# Patient Record
Sex: Female | Born: 1975 | Race: White | Hispanic: No | Marital: Married | Smoking: Never smoker
Health system: Southern US, Community
[De-identification: ages and names within clinical notes are randomized; demographics above are authoritative.]

## PROBLEM LIST (undated history)

## (undated) DIAGNOSIS — T7840XA Allergy, unspecified, initial encounter: Secondary | ICD-10-CM

## (undated) DIAGNOSIS — Z8669 Personal history of other diseases of the nervous system and sense organs: Secondary | ICD-10-CM

## (undated) DIAGNOSIS — D649 Anemia, unspecified: Secondary | ICD-10-CM

## (undated) DIAGNOSIS — R87629 Unspecified abnormal cytological findings in specimens from vagina: Secondary | ICD-10-CM

## (undated) DIAGNOSIS — E079 Disorder of thyroid, unspecified: Secondary | ICD-10-CM

## (undated) HISTORY — DX: Anemia, unspecified: D64.9

## (undated) HISTORY — DX: Unspecified abnormal cytological findings in specimens from vagina: R87.629

## (undated) HISTORY — DX: Allergy, unspecified, initial encounter: T78.40XA

## (undated) HISTORY — PX: DILATION AND CURETTAGE OF UTERUS: SHX78

## (undated) HISTORY — DX: Disorder of thyroid, unspecified: E07.9

## (undated) HISTORY — DX: Personal history of other diseases of the nervous system and sense organs: Z86.69

---

## 2010-03-02 DIAGNOSIS — D649 Anemia, unspecified: Secondary | ICD-10-CM

## 2010-03-02 HISTORY — DX: Anemia, unspecified: D64.9

## 2010-03-24 ENCOUNTER — Ambulatory Visit: Payer: Self-pay | Admitting: Obstetrics & Gynecology

## 2010-03-25 ENCOUNTER — Encounter: Payer: Self-pay | Admitting: Obstetrics & Gynecology

## 2010-03-25 LAB — CONVERTED CEMR LAB: Yeast Wet Prep HPF POC: NONE SEEN

## 2010-07-14 ENCOUNTER — Ambulatory Visit (HOSPITAL_COMMUNITY): Admission: EM | Admit: 2010-07-14 | Discharge: 2010-07-14 | Payer: Self-pay | Admitting: Internal Medicine

## 2011-04-05 ENCOUNTER — Other Ambulatory Visit: Payer: Self-pay | Admitting: Physician Assistant

## 2011-04-05 ENCOUNTER — Ambulatory Visit (INDEPENDENT_AMBULATORY_CARE_PROVIDER_SITE_OTHER): Payer: BC Managed Care – PPO

## 2011-04-05 DIAGNOSIS — D649 Anemia, unspecified: Secondary | ICD-10-CM

## 2011-04-05 DIAGNOSIS — Z124 Encounter for screening for malignant neoplasm of cervix: Secondary | ICD-10-CM

## 2011-04-05 DIAGNOSIS — Z1151 Encounter for screening for human papillomavirus (HPV): Secondary | ICD-10-CM

## 2011-04-05 DIAGNOSIS — R5381 Other malaise: Secondary | ICD-10-CM

## 2011-04-06 NOTE — Assessment & Plan Note (Signed)
Ruth Parker, Ruth Parker                 ACCOUNT NO.:  [id number]   MEDICAL RECORD NO.:  [id number]          PATIENT TYPE:  POB   LOCATION:  [facility] at [place]         FACILITY:  [facility]   PHYSICIAN:  Allie Bossier, MD        DATE OF BIRTH:  [birth year]   DATE OF SERVICE:  03/24/2010                                  CLINIC NOTE   Ms. Gathright is a 35 year old married, white, gravida 2, para 1, abortus  1.  She has a 24-month-old son.  She comes in here as a new patient.  She has no particular GYN complaints.   PAST MEDICAL HISTORY:  She was noted to be anemic with a hemoglobin of  9.6 on March 02, 2010.  She also possibly has migraines and has seen a  neurologist about this last month.  She complains of some fatigue as  well, and has had a thyroid test that was slightly abnormal, she is  scheduled to have it repeated next week.   SOCIAL HISTORY:  She has been married for 3 years.  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  She complains of breakthrough bleeding on  her Imelda Pillow for the last 3 months.  She denies dyspareunia.  She also  reports some lower back pain, but she thinks that might be from lifting  a 21-pound child.   PAST SURGICAL HISTORY:  D and C for an elective AB.   No latex allergies.  She says the PERCOCET causes her nausea.   MEDICATIONS:  1. Allegra daily.  2. Imelda Pillow daily.  3. Chewable multivitamin/prenatal vitamin daily.  4. She takes Tylenol or ibuprofen on a p.r.n. basis.   PHYSICAL EXAMINATION:  GENERAL:  Well-nourished, well-hydrated, very  thin white female in no apparent distress.  VITAL SIGNS:  Height 5 feet 2 inches, weight 100, blood pressure 125/77,  pulse 87.  HEENT:  Normal.  BREASTS:  Normal bilaterally.  HEART:  Regular rate and rhythm.  LUNGS:  Clear to auscultation bilaterally.  ABDOMEN:  Scaphoid benign.  No palpable hepatosplenomegaly.  EXTERNAL GENITALIA:  No lesions.  Cervix, she is having some bloody  discharge.  There are no gross  abnormalities.  Uterus, normal size and  shape, retroverted, nontender.  Adnexa nontender.  No masses.   ASSESSMENT AND PLAN:  1. Annual exam.  I have checked a Pap smear, recommended self-breast      and self-vulvar exam.  2. Breakthrough bleeding.  I will check again ultrasound as well as      cervical cultures.  When these results are available, she can come      back and we will discuss options.  My suggestion may be changing      her birth control      pills.  She is uncertain as to whether she ever wants to have any      more children.  She described a traumatic birth is her first and      only child.      Allie Bossier, MD     MCD/MEDQ  D:  03/24/2010  T:  03/25/2010  Job:  [id number]

## 2011-06-02 ENCOUNTER — Encounter: Payer: Self-pay | Admitting: *Deleted

## 2011-06-02 ENCOUNTER — Other Ambulatory Visit: Payer: Self-pay | Admitting: *Deleted

## 2011-06-02 ENCOUNTER — Telehealth: Payer: Self-pay

## 2011-06-02 MED ORDER — DROSPIRENONE-ETHINYL ESTRADIOL 3-0.02 MG PO TABS: 1.0000 | ORAL_TABLET | Freq: Every day | ORAL | Status: DC

## 2011-06-02 NOTE — Telephone Encounter (Signed)
Pt's request for Gianvi 90 supply sent to [facility]

## 2011-06-02 NOTE — Telephone Encounter (Signed)
Would like to know if she can have her prescription for Helen Hashimoto rewritten to a 90 day supply called [facility] [place] [address].

## 2012-05-21 ENCOUNTER — Other Ambulatory Visit: Payer: Self-pay | Admitting: Physician Assistant

## 2012-06-15 ENCOUNTER — Other Ambulatory Visit: Payer: Self-pay | Admitting: Physician Assistant

## 2012-06-20 ENCOUNTER — Ambulatory Visit (INDEPENDENT_AMBULATORY_CARE_PROVIDER_SITE_OTHER): Payer: BC Managed Care – PPO | Admitting: Obstetrics & Gynecology

## 2012-06-20 ENCOUNTER — Encounter: Payer: Self-pay | Admitting: Obstetrics & Gynecology

## 2012-06-20 VITALS — BP 112/76 | HR 77 | Temp 98.5°F | Resp 16 | Ht 62.0 in | Wt 108.0 lb

## 2012-06-20 DIAGNOSIS — Z113 Encounter for screening for infections with a predominantly sexual mode of transmission: Secondary | ICD-10-CM

## 2012-06-20 DIAGNOSIS — Z0000 Encounter for general adult medical examination without abnormal findings: Secondary | ICD-10-CM

## 2012-06-20 DIAGNOSIS — Z124 Encounter for screening for malignant neoplasm of cervix: Secondary | ICD-10-CM

## 2012-06-20 DIAGNOSIS — Z1151 Encounter for screening for human papillomavirus (HPV): Secondary | ICD-10-CM

## 2012-06-20 MED ORDER — DROSPIRENONE-ETHINYL ESTRADIOL 3-0.02 MG PO TABS: 1.0000 | ORAL_TABLET | Freq: Every day | ORAL | Status: DC

## 2012-06-20 NOTE — Progress Notes (Signed)
Subjective:    Ruth Parker is a 36 y.o. female who presents for an annual exam. The patient has no complaints today. She reports gaining weight. The patient is sexually active. GYN screening history: last pap: was normal. The patient wears seatbelts: yes. The patient participates in regular exercise: yes. Has the patient ever been transfused or tattooed?: no. The patient reports that there is not domestic violence in her life.   Menstrual History: OB History    Grav Para Term Preterm Abortions TAB SAB Ect Mult Living   2 1   1 1    1       Menarche age: 44 Patient's last menstrual period was 06/17/2012.    The following portions of the patient's history were reviewed and updated as appropriate: allergies, current medications, past family history, past medical history, past social history, past surgical history and problem list.  Review of Systems A comprehensive review of systems was negative. 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 Married for 4 years, denies dysparunia.   Objective:    BP 112/76  Pulse 77  Temp 98.5 F (36.9 C) (Oral)  Resp 16  Ht 5\' 2"  (1.575 m)  Wt 108 lb (48.988 kg)  BMI 19.75 kg/m2  LMP 06/17/2012  General Appearance:    Alert, cooperative, no distress, appears stated age  Head:    Normocephalic, without obvious abnormality, atraumatic  Eyes:    PERRL, conjunctiva/corneas clear, EOM's intact, fundi    benign, both eyes  Ears:    Normal TM's and external ear canals, both ears  Nose:   Nares normal, septum midline, mucosa normal, no drainage    or sinus tenderness  Throat:   Lips, mucosa, and tongue normal; teeth and gums normal  Neck:   Supple, symmetrical, trachea midline, no adenopathy;    thyroid:  no enlargement/tenderness/nodules; no carotid   bruit or JVD  Back:     Symmetric, no curvature, ROM normal, no CVA tenderness  Lungs:     Clear to auscultation bilaterally, respirations unlabored  Chest Wall:    No tenderness or  deformity   Heart:    Regular rate and rhythm, S1 and S2 normal, no murmur, rub   or gallop  Breast Exam:    No tenderness, masses, or nipple abnormality  Abdomen:     Soft, non-tender, bowel sounds active all four quadrants,    no masses, no organomegaly  Genitalia:    Normal female without lesion, discharge or tenderness, NSSA, NT, no adnexal masses     Extremities:   Extremities normal, atraumatic, no cyanosis or edema  Pulses:   2+ and symmetric all extremities  Skin:   Skin color, texture, turgor normal, no rashes or lesions  Lymph nodes:   Cervical, supraclavicular, and axillary nodes normal  Neurologic:   CNII-XII intact, normal strength, sensation and reflexes    throughout  .    Assessment:    Healthy female exam.    Plan:     Pap smear.  I will refill her OCPs, discussed continuous use.

## 2012-06-21 ENCOUNTER — Ambulatory Visit: Payer: BC Managed Care – PPO | Admitting: Obstetrics & Gynecology

## 2012-06-29 ENCOUNTER — Encounter: Payer: Self-pay | Admitting: *Deleted

## 2012-06-29 ENCOUNTER — Telehealth: Payer: Self-pay | Admitting: *Deleted

## 2012-06-29 NOTE — Telephone Encounter (Signed)
Copy of pap smear and info on ASCUS sent to pt and reminded to have pap repeated in 1 year.

## 2013-04-07 ENCOUNTER — Encounter: Payer: Self-pay | Admitting: *Deleted

## 2013-04-07 ENCOUNTER — Emergency Department (INDEPENDENT_AMBULATORY_CARE_PROVIDER_SITE_OTHER)
Admission: EM | Admit: 2013-04-07 | Discharge: 2013-04-07 | Disposition: A | Discharge status: Home / Self Care | Payer: BC Managed Care – PPO | Source: Home / Self Care | Attending: Family Medicine | Admitting: Family Medicine

## 2013-04-07 DIAGNOSIS — H6590 Unspecified nonsuppurative otitis media, unspecified ear: Secondary | ICD-10-CM

## 2013-04-07 DIAGNOSIS — J309 Allergic rhinitis, unspecified: Secondary | ICD-10-CM

## 2013-04-07 DIAGNOSIS — J069 Acute upper respiratory infection, unspecified: Secondary | ICD-10-CM

## 2013-04-07 DIAGNOSIS — J302 Other seasonal allergic rhinitis: Secondary | ICD-10-CM

## 2013-04-07 MED ORDER — PREDNISONE 20 MG PO TABS: 20.0000 mg | ORAL_TABLET | Freq: Two times a day (BID) | ORAL | Status: DC

## 2013-04-07 MED ORDER — AMOXICILLIN 875 MG PO TABS: 875.0000 mg | ORAL_TABLET | Freq: Two times a day (BID) | ORAL | Status: DC

## 2013-04-07 NOTE — ED Provider Notes (Signed)
History     CSN: [id number]  Arrival date & time 04/07/13  4098   First MD Initiated Contact with Patient 04/07/13 1003      Chief Complaint  Patient presents with  . Nasal Congestion  . Otalgia       HPI Comments: Patient complains of increased sinus congestion for about 3 weeks.  Initially she had eye redness, now resolved.  Two weeks ago she visited her PCP who gave her a steroid injection, followed by oral prednisone for five days. Over the past five days she has had a mild sore throat, recurrent sinus congestion, chills, and an occasional cough. She has a history of recurrent sinus congestion.  In December 2013, she had a CT of her head that showed a deviated septum  The history is provided by the patient.    Past Medical History  Diagnosis Date  . Anemia 03/02/10  . History of migraines     Seeing neurologist  . Thyroid disease     abn labs by pcp    Past Surgical History  Procedure Laterality Date  . Dilation and curettage of uterus      elective AB    History reviewed. No pertinent family history.  History  Substance Use Topics  . Smoking status: Not on file  . Smokeless tobacco: Not on file  . Alcohol Use:     OB History   Grav Para Term Preterm Abortions TAB SAB Ect Mult Living   2 1   1 1    1       Review of Systems + sore throat + occasional cough No pleuritic pain No wheezing + nasal congestion + post-nasal drainage ? sinus pain/pressure No itchy/red eyes + left earache No hemoptysis No SOB No fever, + chills No nausea No vomiting No abdominal pain No diarrhea No urinary symptoms No skin rashes + fatigue No myalgias No headache    Allergies  Percocet  Home Medications   Current Outpatient Rx  Name  Route  Sig  Dispense  Refill  . acetaminophen (TYLENOL) 325 MG tablet   Oral   Take 650 mg by mouth every 6 (six) hours as needed.           Marland Kitchen amoxicillin (AMOXIL) 875 MG tablet   Oral   Take 1 tablet (875 mg total) by  mouth 2 (two) times daily.   20 tablet   0   . drospirenone-ethinyl estradiol (GIANVI) 3-0.02 MG tablet   Oral   Take 1 tablet by mouth daily.   3 Package   6   . fexofenadine (ALLEGRA) 180 MG tablet   Oral   Take 180 mg by mouth daily.           . fluticasone (FLONASE) 50 MCG/ACT nasal spray   Nasal   Place 2 sprays into the nose daily.           . naproxen (NAPROSYN) 500 MG tablet   Oral   Take 500 mg by mouth 3 (three) times daily as needed.           . predniSONE (DELTASONE) 20 MG tablet   Oral   Take 1 tablet (20 mg total) by mouth 2 (two) times daily. Take with food.   10 tablet   0     BP 105/64  Pulse 74  Temp(Src) 97.7 F (36.5 C) (Oral)  Resp 16  Ht 5' 2.25" (1.581 m)  Wt 113 lb 4 oz (51.37 kg)  BMI 20.55 kg/m2  SpO2 100%  Physical Exam Nursing notes and Vital Signs reviewed. Appearance:  Patient appears healthy, stated age, and in no acute distress Eyes:  Pupils are equal, round, and reactive to light and accomodation.  Extraocular movement is intact.  Conjunctivae are not inflamed  Ears:  Canals normal.  Tympanic membranes normal but left tympanic membrane has serous effusion present.  Nose:  Congested turbinates, worse on left.  No sinus tenderness.   Pharynx:  Normal Neck:  Supple.  Slightly tender shotty posterior nodes are palpated bilaterally  Lungs:  Clear to auscultation.  Breath sounds are equal.  Heart:  Regular rate and rhythm without murmurs, rubs, or gallops.  Abdomen:  Nontender without masses or hepatosplenomegaly.  Bowel sounds are present.  No CVA or flank tenderness.  Extremities:  No edema.  No calf tenderness Skin:  No rash present.   ED Course  Procedures  none  Labs Reviewed -  Tympanogram:  Normal right ear; Positive peak pressure left ear     1. Seasonal rhinitis   2. Serous otitis media, left   3. Acute upper respiratory infections of unspecified site; suspect new onset viral URI       MDM  Begin  Amoxicillin, and prednisone burst. Take Mucinex D (guaifenesin with decongestant) twice daily for congestion.  Increase fluid intake, rest. May use Afrin nasal spray (or generic oxymetazoline) twice daily for about 5 days.  Also recommend using saline nasal spray several times daily and saline nasal irrigation (AYR is a common brand).  Use Flonase spray after Afrin spray and saline irrigation Stop all antihistamines for now, and other non-prescription cough/cold preparations. Followup with ENT if not improving 10 days.        Lattie Haw, MD 04/09/13 [phone]

## 2013-04-07 NOTE — ED Notes (Signed)
Patient c/o nasal congestion, rhinitis, and left ear pain x 2 wk's. Has tried OTC Allegra, Sudafed, and Alka-selzer with no relief. Patient states she went to her primary and was given Prednisone with no relief.

## 2013-07-31 ENCOUNTER — Other Ambulatory Visit: Payer: Self-pay | Admitting: Obstetrics & Gynecology

## 2014-09-23 ENCOUNTER — Encounter: Payer: Self-pay | Admitting: *Deleted

## 2014-09-24 ENCOUNTER — Other Ambulatory Visit: Payer: Self-pay | Admitting: Obstetrics & Gynecology

## 2014-10-08 ENCOUNTER — Ambulatory Visit (INDEPENDENT_AMBULATORY_CARE_PROVIDER_SITE_OTHER): Payer: BC Managed Care – PPO | Admitting: Obstetrics & Gynecology

## 2014-10-08 ENCOUNTER — Encounter: Payer: Self-pay | Admitting: Obstetrics & Gynecology

## 2014-10-08 VITALS — BP 136/85 | HR 80 | Resp 16 | Ht 62.0 in | Wt 115.0 lb

## 2014-10-08 DIAGNOSIS — Z1151 Encounter for screening for human papillomavirus (HPV): Secondary | ICD-10-CM | POA: Diagnosis not present

## 2014-10-08 DIAGNOSIS — Z124 Encounter for screening for malignant neoplasm of cervix: Secondary | ICD-10-CM | POA: Diagnosis not present

## 2014-10-08 DIAGNOSIS — Z0000 Encounter for general adult medical examination without abnormal findings: Secondary | ICD-10-CM

## 2014-10-08 DIAGNOSIS — Z23 Encounter for immunization: Secondary | ICD-10-CM

## 2014-10-08 MED ORDER — DROSPIRENONE-ETHINYL ESTRADIOL 3-0.02 MG PO TABS: 1.0000 | ORAL_TABLET | Freq: Every day | ORAL | Status: DC

## 2014-10-08 MED ORDER — NAPROXEN 500 MG PO TABS: 500.0000 mg | ORAL_TABLET | Freq: Three times a day (TID) | ORAL | Status: DC | PRN

## 2014-10-08 NOTE — Addendum Note (Signed)
Addended by: Allie BossierVE, Kamal Jurgens C on: 10/08/2014 02:26 PM   Modules accepted: Orders

## 2014-10-08 NOTE — Progress Notes (Signed)
Subjective:    Ruth Parker is a 38 y.o. MW 17P1 (37 yo son) female who presents for an annual exam. The patient has no complaints today. The patient is sexually active. GYN screening history: last pap: was normal. The patient wears seatbelts: yes. The patient participates in regular exercise: no. Has the patient ever been transfused or tattooed?: no. The patient reports that there is not domestic violence in her life.   Menstrual History: OB History    Gravida Para Term Preterm AB TAB SAB Ectopic Multiple Living   2 1   1 1    1       Menarche age: [age]  Patient's last menstrual period was 10/06/2014.    The following portions of the patient's history were reviewed and updated as appropriate: allergies, current medications, past family history, past medical history, past social history, past surgical history and problem list.  Review of Systems A comprehensive review of systems was negative. Monogamous for 17 years, married. Denies dyspareunia. 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    Objective:    BP 136/85 mmHg  Pulse 80  Resp 16  Ht 5\' 2"  (1.575 m)  Wt 115 lb (52.164 kg)  BMI 21.03 kg/m2  LMP 10/06/2014  General Appearance:    Alert, cooperative, no distress, appears stated age  Head:    Normocephalic, without obvious abnormality, atraumatic  Eyes:    PERRL, conjunctiva/corneas clear, EOM's intact, fundi    benign, both eyes  Ears:    Normal TM's and external ear canals, both ears  Nose:   Nares normal, septum midline, mucosa normal, no drainage    or sinus tenderness  Throat:   Lips, mucosa, and tongue normal; teeth and gums normal  Neck:   Supple, symmetrical, trachea midline, no adenopathy;    thyroid:  no enlargement/tenderness/nodules; no carotid   bruit or JVD  Back:     Symmetric, no curvature, ROM normal, no CVA tenderness  Lungs:     Clear to auscultation bilaterally, respirations unlabored  Chest Wall:    No tenderness or deformity   Heart:    Regular rate and rhythm, S1  and S2 normal, no murmur, rub   or gallop  Breast Exam:    No tenderness, masses, or nipple abnormality  Abdomen:     Soft, non-tender, bowel sounds active all four quadrants,    no masses, no organomegaly  Genitalia:    Normal female without lesion, discharge or tenderness, shaved, varicosities, NSSA, NT, normal adnexal exam     Extremities:   Extremities normal, atraumatic, no cyanosis or edema  Pulses:   2+ and symmetric all extremities  Skin:   Skin color, texture, turgor normal, no rashes or lesions  Lymph nodes:   Cervical, supraclavicular, and axillary nodes normal  Neurologic:   CNII-XII intact, normal strength, sensation and reflexes    throughout  .    Assessment:    Healthy female exam.    Plan:     Breast self exam technique reviewed and patient encouraged to perform self-exam monthly. Thin prep Pap smear. with cotesting

## 2014-10-10 LAB — CYTOLOGY - PAP

## 2015-10-18 ENCOUNTER — Other Ambulatory Visit: Payer: Self-pay | Admitting: Obstetrics & Gynecology

## 2015-10-21 ENCOUNTER — Other Ambulatory Visit: Payer: Self-pay | Admitting: Obstetrics & Gynecology

## 2015-10-23 ENCOUNTER — Other Ambulatory Visit: Payer: Self-pay | Admitting: *Deleted

## 2015-10-23 MED ORDER — NAPROXEN 500 MG PO TABS: 500.0000 mg | ORAL_TABLET | Freq: Three times a day (TID) | ORAL | Status: DC | PRN

## 2015-10-23 NOTE — Telephone Encounter (Signed)
RF request for Naprosyn 500 mg  [facility] Marice Potterove

## 2016-03-31 LAB — LIPID PANEL
CHOLESTEROL: 183 mg/dL (ref 0–200)
HDL: 67 mg/dL (ref 35–70)
LDL CALC: 89 mg/dL
TRIGLYCERIDES: 134 mg/dL (ref 40–160)

## 2016-03-31 LAB — BASIC METABOLIC PANEL: Glucose: 89 mg/dL

## 2016-05-07 ENCOUNTER — Encounter: Payer: Self-pay | Admitting: Emergency Medicine

## 2016-05-07 ENCOUNTER — Emergency Department (INDEPENDENT_AMBULATORY_CARE_PROVIDER_SITE_OTHER)
Admission: EM | Admit: 2016-05-07 | Discharge: 2016-05-07 | Disposition: A | Discharge status: Home / Self Care | Payer: Self-pay | Source: Home / Self Care | Attending: Family Medicine | Admitting: Family Medicine

## 2016-05-07 DIAGNOSIS — R197 Diarrhea, unspecified: Secondary | ICD-10-CM

## 2016-05-07 DIAGNOSIS — R1031 Right lower quadrant pain: Secondary | ICD-10-CM | POA: Diagnosis not present

## 2016-05-07 DIAGNOSIS — R112 Nausea with vomiting, unspecified: Secondary | ICD-10-CM

## 2016-05-07 DIAGNOSIS — R1011 Right upper quadrant pain: Secondary | ICD-10-CM

## 2016-05-07 LAB — COMPLETE METABOLIC PANEL WITH GFR
ALT: 10 U/L (ref 6–29)
AST: 19 U/L (ref 10–30)
Albumin: 4.2 g/dL (ref 3.6–5.1)
Alkaline Phosphatase: 38 U/L (ref 33–115)
BUN: 12 mg/dL (ref 7–25)
CO2: 21 mmol/L (ref 20–31)
Calcium: 8.6 mg/dL (ref 8.6–10.2)
Chloride: 106 mmol/L (ref 98–110)
Creat: 0.55 mg/dL (ref 0.50–1.10)
GFR, Est African American: >89 mL/min (ref >=60)
GFR, Est Non African American: >89 mL/min (ref >=60)
Glucose, Bld: 110 mg/dL — ABNORMAL HIGH (ref 65–99)
Potassium: 3.9 mmol/L (ref 3.5–5.3)
Sodium: 139 mmol/L (ref 135–146)
Total Bilirubin: 0.5 mg/dL (ref 0.2–1.2)
Total Protein: 7.2 g/dL (ref 6.1–8.1)

## 2016-05-07 MED ORDER — PROMETHAZINE HCL 25 MG PO TABS: 25.0000 mg | ORAL_TABLET | Freq: Four times a day (QID) | ORAL | Status: DC | PRN

## 2016-05-07 MED ORDER — SODIUM CHLORIDE 0.9 % IV BOLUS (SEPSIS): 1000.0000 mL | Freq: Once | INTRAVENOUS | Status: DC

## 2016-05-07 MED ORDER — ONDANSETRON 4 MG PO TBDP
4.0000 mg | ORAL_TABLET | Freq: Once | ORAL | Status: AC
  Administered 2016-05-07: 4 mg via ORAL

## 2016-05-07 MED ORDER — SODIUM CHLORIDE 0.9 % IV BOLUS (SEPSIS)
1000.0000 mL | Freq: Once | INTRAVENOUS | Status: AC
  Administered 2016-05-07: 1000 mL via INTRAVENOUS

## 2016-05-07 NOTE — ED Notes (Signed)
Nausea, vomiting, diarrhea since 1 am this morning

## 2016-05-07 NOTE — ED Provider Notes (Signed)
CSN: [id number]     Arrival date & time 05/07/16  0830 History   First MD Initiated Contact with Patient 05/07/16 [phone]     Chief Complaint  Patient presents with  . Nausea  . Emesis  . Diarrhea   (Consider location/radiation/quality/duration/timing/severity/associated sxs/prior Treatment) HPI  Ruth Parker is a 40 y.o. female presenting to UC with c/o sudden onset n/v/d that started around 1AM this morning.  Pt states she has had about 10 episodes of vomiting and watery diarrhea. Associated generalized abdominal cramping, worse on Right side.  Pt notes her husband woke up around the same time as well but has only vomited once.  She has been trying to sip on some water but has not been able to keep anything down. No treatments tried PTA.  Denies urinary symptoms. She notes they did eat at a [place] restaurant Wednesday night but have been to that same place many times before.  Denies recent URI symptoms of cough or congestion.  Denies recent travel. Pt denies hx of abdominal surgeries, however, PSH notes dilation and curettage of uterus.  No vaginal symptoms.    Past Medical History  Diagnosis Date  . Anemia 03/02/10  . History of migraines     Seeing neurologist  . Thyroid disease     abn labs by pcp   Past Surgical History  Procedure Laterality Date  . Dilation and curettage of uterus      elective AB   Family History  Problem Relation Age of Onset  . Diabetes Maternal Grandmother   . Breast cancer Maternal Aunt   . Breast cancer Maternal Grandmother   . Glaucoma Mother    Social History  Substance Use Topics  . Smoking status: Never Smoker   . Smokeless tobacco: Never Used  . Alcohol Use: 0.0 oz/week    0 Standard drinks or equivalent per week     Comment: Occasionally   OB History    Gravida Para Term Preterm AB TAB SAB Ectopic Multiple Living   2 1   1 1    1      Review of Systems  Constitutional: Negative for fever and chills.  HENT: Negative for congestion, ear  pain, sore throat, trouble swallowing and voice change.   Respiratory: Negative for cough and shortness of breath.   Cardiovascular: Negative for chest pain and palpitations.  Gastrointestinal: Positive for nausea, vomiting, abdominal pain and diarrhea. Negative for blood in stool.  Genitourinary: Negative for dysuria, urgency, frequency, hematuria, flank pain, vaginal discharge, vaginal pain and pelvic pain.  Musculoskeletal: Negative for myalgias, back pain and arthralgias.  Skin: Negative for rash.  Neurological: Negative for dizziness, light-headedness and headaches.    Allergies  Percocet  Home Medications   Prior to Admission medications   Medication Sig Start Date End Date Taking? Authorizing Provider  acetaminophen (TYLENOL) 325 MG tablet Take 650 mg by mouth every 6 (six) hours as needed.      Historical Provider, MD  Alcaftadine (LASTACAFT) 0.25 % SOLN Apply 1 drop to eye.    Historical Provider, MD  fexofenadine (ALLEGRA) 180 MG tablet Take 180 mg by mouth daily.      Historical Provider, MD  fluticasone (FLONASE) 50 MCG/ACT nasal spray Place 2 sprays into the nose daily.      Historical Provider, MD  LORYNA 3-0.02 MG tablet TAKE 1 TABLET BY MOUTH DAILY. 10/14/14   Myra Inda Coke Dove, MD  LORYNA 3-0.02 MG tablet TAKE 1 TABLET BY MOUTH DAILY. 10/21/15  Allie Bossier, MD  naproxen (NAPROSYN) 500 MG tablet Take 1 tablet (500 mg total) by mouth 3 (three) times daily as needed. 10/23/15   Allie Bossier, MD  polyvinyl alcohol (LIQUIFILM TEARS) 1.4 % ophthalmic solution Apply 1 drop to eye.    Historical Provider, MD   Meds Ordered and Administered this Visit   Medications  ondansetron (ZOFRAN-ODT) disintegrating tablet 4 mg (4 mg Oral Given 05/07/16 0922)  sodium chloride 0.9 % bolus 1,000 mL (1,000 mLs Intravenous Given 05/07/16 0936)    BP 115/80 mmHg  Pulse 107  Temp(Src) 98.3 F (36.8 C) (Oral)  Ht  (1.575 m)  Wt 114 lb (51.71 kg)  BMI 20.85 kg/m2  SpO2 98%  LMP  04/16/2016 No data found.   Physical Exam  Constitutional: She is oriented to person, place, and time. She appears well-developed and well-nourished. No distress.  Pt lying on exam bed, NAD. Alert.   HENT:  Head: Normocephalic and atraumatic.  Mouth/Throat: Oropharynx is clear and moist.  Eyes: Conjunctivae are normal. No scleral icterus.  Neck: Normal range of motion.  Cardiovascular: Regular rhythm and normal heart sounds.  Tachycardia present.   Mild tachycardia  Pulmonary/Chest: Effort normal and breath sounds normal. No respiratory distress. She has no wheezes. She has no rales. She exhibits no tenderness.  Abdominal: Soft. She exhibits no distension and no mass. Bowel sounds are increased. There is tenderness. There is no rebound, no guarding and no CVA tenderness.  Soft, non-distended, mild diffuse tenderness. No rebound, guarding or masses palpated.   Musculoskeletal: Normal range of motion.  Neurological: She is alert and oriented to person, place, and time.  Skin: Skin is warm and dry. She is not diaphoretic.  Nursing note and vitals reviewed.   ED Course  Procedures (including critical care time)  Labs Review Labs Reviewed  COMPLETE METABOLIC PANEL WITH GFR  POCT CBC W AUTO DIFF ([facility])    Imaging Review No results found.    MDM  No diagnosis found.  Pt c/o n/v/d, generalized abdominal pain, worse on Right side w/o rebound or guarding. Mild tachycardia at 107bmp. Pt is afebrile, BP 115/80. Moist mucous membranes.  CBC: unremarkable CMP: pending.  Low concern for appendicitis or cholecystitis given pt's husband also having GI symptoms.  Pt is afebrile and no focal tenderness.  Tx in UC: Zofran and IV fluids.  10:08 AM pt has received about of 1L of fluids, she reports continued sore cramping pain on Right side of abdomen, mild tenderness. No rebound or guarding.  Discussed getting CT abd to r/o appendicitis. Pt very hesitant as she had a  CT in the past for similar symptoms and did not have appendicitis. Concerned for financial reasons.   10:24 AM pt discussed CT vs watch and wait with husband, pt would like to wait to see how she feels later today.  Agree pt is safe to do so given still likely viral in nature as pt's husband has similar symptoms, however, explained due to localized tenderness to RLQ, cannot r/o appendicitis w/o CT scan.  Will provided strict return precautions.   Pt able to keep down several ounces of water in UC. Safe for discharge home. Rx: Phenergan Patient verbalized understanding and agreement with treatment plan.    Junius Finner, PA-C 05/07/16 1031

## 2016-05-10 ENCOUNTER — Telehealth: Payer: Self-pay | Admitting: *Deleted

## 2016-05-10 NOTE — ED Notes (Signed)
Callback: No answer, LMOM f/u from visit, call back as needed.  

## 2016-05-17 ENCOUNTER — Ambulatory Visit (INDEPENDENT_AMBULATORY_CARE_PROVIDER_SITE_OTHER): Payer: BLUE CROSS/BLUE SHIELD | Admitting: Physician Assistant

## 2016-05-17 ENCOUNTER — Encounter: Payer: Self-pay | Admitting: Physician Assistant

## 2016-05-17 VITALS — BP 99/66 | HR 74 | Ht 62.0 in | Wt 114.0 lb

## 2016-05-17 DIAGNOSIS — Z23 Encounter for immunization: Secondary | ICD-10-CM | POA: Diagnosis not present

## 2016-05-17 DIAGNOSIS — Z0000 Encounter for general adult medical examination without abnormal findings: Secondary | ICD-10-CM

## 2016-05-17 DIAGNOSIS — R011 Cardiac murmur, unspecified: Secondary | ICD-10-CM | POA: Diagnosis not present

## 2016-05-17 NOTE — Patient Instructions (Addendum)
Keeping You Healthy  Get These Tests  Blood Pressure- Have your blood pressure checked once a year by your health care provider.  Normal blood pressure is 120/80.  Weight- Have your body mass index (BMI) calculated to screen for obesity.  BMI is measure of body fat based on height and weight.  You can also calculate your own BMI at https://www.west-esparza.com/www.nhlbisupport.com/bmi/.  Cholesterol- Have your cholesterol checked every 5 years starting at age 39 then yearly starting at age 39.  Chlamydia, HIV, and other sexually transmitted diseases- Get screened every year until age 39, then within three months of each new sexual provider.  Pap Test - Every 1-5 years; discuss with your health care provider.  Mammogram- Every 1-2 years starting at age 39--50  Take these medicines  Calcium with Vitamin D-Your body needs 1200 mg of Calcium each day and [phone] IU of Vitamin D daily.  Your body can only absorb 500 mg of Calcium at a time so Calcium must be taken in 2 or 3 divided doses throughout the day.  Multivitamin with folic acid- Once daily if it is possible for you to become pregnant.  Get these Immunizations  Gardasil-Series of three doses; prevents HPV related illness such as genital warts and cervical cancer.  Menactra-Single dose; prevents meningitis.  Tetanus shot- Every 10 years.  Flu shot-Every year.  Take these steps  Do not smoke-Your healthcare provider can help you quit.  For tips on how to quit go to www.smokefree.gov or call 1-800 QUITNOW.  Be physically active- Exercise 5 days a week for at least 30 minutes.  If you are not already physically active, start slow and gradually work up to 30 minutes of moderate physical activity.  Examples of moderate activity include walking briskly, dancing, swimming, bicycling, etc.  Breast Cancer- A self breast exam every month is important for early detection of breast cancer.  For more information and instruction on self breast exams, ask your  healthcare provider or [place].eswww.womenshealth.gov/faq/breast-self-exam.cfm.  Eat a healthy diet- Eat a variety of healthy foods such as fruits, vegetables, whole grains, low fat milk, low fat cheeses, yogurt, lean meats, poultry and fish, beans, nuts, tofu, etc.  For more information go to www. Thenutritionsource.org  Drink alcohol in moderation- Limit alcohol intake to one drink or less per day. Never drink and drive.  Depression- Your emotional health is as important as your physical health.  If you're feeling down or losing interest in things you normally enjoy please talk to your healthcare provider about being screened for depression.  Dental visit- Brush and floss your teeth twice daily; visit your dentist twice a year.  Eye doctor- Get an eye exam at least every 2 years.  Helmet use- Always wear a helmet when riding a bicycle, motorcycle, rollerblading or skateboarding.  Safe sex- If you may be exposed to sexually transmitted infections, use a condom.  Seat belts- Seat belts can save your live; always wear one.  Smoke/Carbon Monoxide detectors- These detectors need to be installed on the appropriate level of your home. Replace batteries at least once a year.  Skin cancer- When out in the sun please cover up and use sunscreen 15 SPF or higher.  Violence- If anyone is threatening or hurting you, please tell your healthcare provider.       Heart Murmur A heart murmur is an extra sound heard by your health care provider when listening to your heart with a device called a stethoscope. The sound comes from turbulence when  blood flows through the heart and may be a "hum" or "whoosh" sound heard when the heart beats. There are two types of heart murmurs:  Innocent murmurs. Most people with this type of heart murmur do not have a heart problem. Many children have innocent heart murmurs. Your health care provider may suggest some basic testing to know whether your murmur is an innocent murmur.  If an innocent heart murmur is found, there is no need for further tests or treatment and no need to restrict activities or stop playing sports.  Abnormal murmurs. These types of murmurs can occur in children and adults. In children, abnormal heart murmurs are typically caused from heart defects that are present at birth (congenital). In adults, abnormal murmurs are usually from heart valve problems caused by disease, infection, or aging. CAUSES  Normally, these valves open to let blood flow through or out of your heart and then shut to keep it from flowing backward. If they do not work properly, you could have:  Regurgitation--When blood leaks back through the valve in the wrong direction.  Mitral valve prolapse--When the mitral valve of the heart has a loose flap and does not close tightly.  Stenosis--When the valve does not open enough and blocks blood flow. SIGNS AND SYMPTOMS  Innocent murmurs do not cause symptoms, and many people with abnormal murmurs may or may not have symptoms. If symptoms do develop, they may include:  Shortness of breath.  Blue coloring of the skin, especially on the fingertips.  Chest pain.  Palpitations, or feeling a fluttering or skipped heartbeat.  Fainting.  Persistent cough.  Getting tired much faster than expected. DIAGNOSIS  A heart murmur might be heard during a sports physical or during any type of examination. When a murmur is heard, it may suggest a possible problem. When this happens, your health care provider may ask you to see a heart specialist (cardiologist). You may also be asked to have one or more heart tests. In these cases, testing may vary depending on what your health care provider heard. Tests for a heart murmur may include:  Electrocardiogram.  Echocardiogram.  MRI. For children and adults who have an abnormal heart murmur and want to play sports, it is important to complete testing, review test results, and receive  recommendations from your health care provider. If heart disease is present, it may not be safe to play. TREATMENT  Innocent murmurs require no treatment or activity restriction. If an abnormal murmur represents a problem with the heart, treatment will depend on the exact nature of the problem. In these cases, medicine or surgery may be needed to treat the problem. HOME CARE INSTRUCTIONS If you want to participate in sports or other types of strenuous physical activity, it is important to discuss this first with your health care provider. If the murmur represents a problem with the heart and you choose to participate in sports, there is a small chance that a serious problem (including sudden death) could result.  SEEK MEDICAL CARE IF:   You feel that your symptoms are slowly worsening.  You develop any new symptoms that cause concern.  You feel that you are having side effects from any medicines prescribed. SEEK IMMEDIATE MEDICAL CARE IF:   You develop chest pain.  You have shortness of breath.  You notice that your heart beats irregularly often enough to cause you to worry.  You have fainting spells.  Your symptoms suddenly get worse.   This information is not  intended to replace advice given to you by your health care provider. Make sure you discuss any questions you have with your health care provider.   Document Released: 12/16/2004 Document Revised: 11/29/2014 Document Reviewed: 07/16/2013 Elsevier Interactive Patient Education [employer]

## 2016-05-17 NOTE — Progress Notes (Signed)
  Subjective:     Ruth Parker is a 40 y.o. female and is here for a comprehensive physical exam. The patient reports no problems.  Social History   Social History  . Marital Status: Married    Spouse Name: N/A  . Number of Children: N/A  . Years of Education: N/A   Occupational History  . Not on file.   Social History Main Topics  . Smoking status: Never Smoker   . Smokeless tobacco: Never Used  . Alcohol Use: 0.0 oz/week    0 Standard drinks or equivalent per week     Comment: Occasionally  . Drug Use: No  . Sexual Activity:    Partners: Male    Birth Control/ Protection: Pill   Other Topics Concern  . Not on file   Social History Narrative   Health Maintenance  Topic Date Due  . HIV Screening  03/10/1992  . TETANUS/TDAP  03/10/1996  . INFLUENZA VACCINE  06/22/2016  . PAP SMEAR  10/08/2017    The following portions of the patient's history were reviewed and updated as appropriate: allergies, current medications, past family history, past medical history, past social history, past surgical history and problem list.  Review of Systems Pertinent items noted in HPI and remainder of comprehensive ROS otherwise negative.   Objective:    BP 99/66 mmHg  Pulse 74  Ht 5\' 2"  (1.575 m)  Wt 114 lb (51.71 kg)  BMI 20.85 kg/m2  LMP 04/16/2016 General appearance: alert, cooperative and appears stated age Head: Normocephalic, without obvious abnormality, atraumatic Eyes: conjunctivae/corneas clear. PERRL, EOM's intact. Fundi benign. Ears: normal TM's and external ear canals both ears Nose: Nares normal. Septum midline. Mucosa normal. No drainage or sinus tenderness. Throat: lips, mucosa, and tongue normal; teeth and gums normal Neck: no adenopathy, no carotid bruit, no JVD, supple, symmetrical, trachea midline and thyroid not enlarged, symmetric, no tenderness/mass/nodules Back: symmetric, no curvature. ROM normal. No CVA tenderness. Lungs: clear to auscultation  bilaterally Heart: Left sternal border heard a faint 2/6 high pitched diasytolic murmur that resolved when auscultated when laying down.  Abdomen: soft, non-tender; bowel sounds normal; no masses,  no organomegaly Extremities: extremities normal, atraumatic, no cyanosis or edema Pulses: 2+ and symmetric Skin: Skin color, texture, turgor normal. No rashes or lesions Lymph nodes: Cervical, supraclavicular, and axillary nodes normal. Neurologic: Alert and oriented X 3, normal strength and tone. Normal symmetric reflexes. Normal coordination and gait    Assessment:    Healthy female exam.       Plan:      CPE- pap up to date. Labs were drawn at [facility]. She signed for us to get records today. Discussed vitamin D 800 units and calcium 1500mg  daily. Encouraged regular diet and exercise. Tdap given today.     Heart murmur- asymptomatic. Handout given. Will get echo to fully evaluate. Reassurance given.  See After Visit Summary for Counseling Recommendations

## 2016-05-18 ENCOUNTER — Other Ambulatory Visit: Payer: Self-pay | Admitting: *Deleted

## 2016-05-18 DIAGNOSIS — R011 Cardiac murmur, unspecified: Secondary | ICD-10-CM

## 2016-05-19 ENCOUNTER — Encounter: Payer: Self-pay | Admitting: Physician Assistant

## 2016-05-26 ENCOUNTER — Ambulatory Visit (HOSPITAL_COMMUNITY)
Admission: RE | Admit: 2016-05-26 | Discharge: 2016-05-26 | Disposition: A | Discharge status: Home / Self Care | Payer: BLUE CROSS/BLUE SHIELD | Source: Ambulatory Visit | Attending: Physician Assistant | Admitting: Physician Assistant

## 2016-05-26 DIAGNOSIS — I340 Nonrheumatic mitral (valve) insufficiency: Secondary | ICD-10-CM | POA: Diagnosis not present

## 2016-05-26 DIAGNOSIS — I071 Rheumatic tricuspid insufficiency: Secondary | ICD-10-CM | POA: Diagnosis not present

## 2016-05-26 DIAGNOSIS — R011 Cardiac murmur, unspecified: Secondary | ICD-10-CM | POA: Diagnosis not present

## 2016-05-26 LAB — ECHOCARDIOGRAM COMPLETE
CHL CUP TV REG PEAK VELOCITY: 171 cm/s
E decel time: 278 msec
EERAT: 5.71
FS: 38 % (ref 28–44)
IVS/LV PW RATIO, ED: 1.27
LA ID, A-P, ES: 29 mm
LA vol A4C: 26.4 ml
LADIAMINDEX: 1.93 cm/m2
LEFT ATRIUM END SYS DIAM: 29 mm
LV E/e' medial: 5.71
LV SIMPSON'S DISK: 60
LV TDI E'LATERAL: 16.9
LV dias vol index: 36 mL/m2
LV e' LATERAL: 16.9 cm/s
LV sys vol index: 14 mL/m2
LV sys vol: 22 mL (ref 14–42)
LVDIAVOL: 54 mL (ref 46–106)
LVEEAVG: 5.71
LVOT SV: 58 mL
LVOT VTI: 23 cm
LVOT area: 2.54 cm2
LVOT diameter: 18 mm
LVOT peak vel: 100 cm/s
MV Dec: 278
MV pk A vel: 48.5 m/s
MVPG: 4 mmHg
MVPKEVEL: 96.5 m/s
PW: 6.9 mm — AB (ref 0.6–1.1)
RV TAPSE: 19.2 mm
Stroke v: 32 ml
TDI e' medial: 11.9
TRMAXVEL: 171 cm/s

## 2016-05-26 NOTE — Progress Notes (Signed)
  Echocardiogram 2D Echocardiogram has been performed.  Ruth SavoyCasey N Jamicia Parker 05/26/2016, 9:51 AM

## 2016-11-23 ENCOUNTER — Other Ambulatory Visit: Payer: Self-pay | Admitting: Obstetrics & Gynecology

## 2016-11-23 MED ORDER — DROSPIRENONE-ETHINYL ESTRADIOL 3-0.02 MG PO TABS: 1.0000 | ORAL_TABLET | Freq: Every day | ORAL | 0 refills | Status: DC

## 2016-11-23 NOTE — Telephone Encounter (Signed)
Pt called requesting a RF on her Yaz.  She is scheduled for annual 12/07/16

## 2016-12-07 ENCOUNTER — Encounter: Payer: Self-pay | Admitting: Obstetrics & Gynecology

## 2016-12-07 ENCOUNTER — Ambulatory Visit (INDEPENDENT_AMBULATORY_CARE_PROVIDER_SITE_OTHER): Payer: BLUE CROSS/BLUE SHIELD | Admitting: Obstetrics & Gynecology

## 2016-12-07 VITALS — BP 127/84 | HR 71 | Ht 62.0 in | Wt 115.0 lb

## 2016-12-07 DIAGNOSIS — Z01419 Encounter for gynecological examination (general) (routine) without abnormal findings: Secondary | ICD-10-CM

## 2016-12-07 DIAGNOSIS — Z0000 Encounter for general adult medical examination without abnormal findings: Secondary | ICD-10-CM

## 2016-12-07 MED ORDER — DROSPIRENONE-ETHINYL ESTRADIOL 3-0.02 MG PO TABS: 1.0000 | ORAL_TABLET | Freq: Every day | ORAL | 5 refills | Status: DC

## 2016-12-07 MED ORDER — DROSPIRENONE-ETHINYL ESTRADIOL 3-0.02 MG PO TABS: 1.0000 | ORAL_TABLET | Freq: Every day | ORAL | 0 refills | Status: DC

## 2016-12-07 NOTE — Progress Notes (Signed)
Subjective:    Ruth Parker is a 41 y.o. MW P68 (62 yo son)  female who presents for an annual exam. The patient has no complaints today. The patient is sexually active. GYN screening history: last pap: was normal. The patient wears seatbelts: yes. The patient participates in regular exercise: yes. Has the patient ever been transfused or tattooed?: no. The patient reports that there is not domestic violence in her life.   Menstrual History: OB History    Gravida Para Term Preterm AB Living   '2 1     1 1   '$ SAB TAB Ectopic Multiple Live Births     1            Menarche age: 65 Patient's last menstrual period was 11/22/2016 (approximate).    The following portions of the patient's history were reviewed and updated as appropriate: allergies, current medications, past family history, past medical history, past social history, past surgical history and problem list.  Review of Systems Pertinent items are noted in HPI.   Monogamous for 20 years.  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 x 2, mGM No gyn/colon can    Objective:    BP 127/84   Pulse 71   Ht '5\' 2"'$  (1.575 m)   Wt 115 lb (52.2 kg)   LMP 11/22/2016 (Approximate)   BMI 21.03 kg/m   General Appearance:    Alert, cooperative, no distress, appears stated age  Head:    Normocephalic, without obvious abnormality, atraumatic  Eyes:    PERRL, conjunctiva/corneas clear, EOM's intact, fundi    benign, both eyes  Ears:    Normal TM's and external ear canals, both ears  Nose:   Nares normal, septum midline, mucosa normal, no drainage    or sinus tenderness  Throat:   Lips, mucosa, and tongue normal; teeth and gums normal  Neck:   Supple, symmetrical, trachea midline, no adenopathy;    thyroid:  no enlargement/tenderness/nodules; no carotid   bruit or JVD  Back:     Symmetric, no curvature, ROM normal, no CVA tenderness  Lungs:     Clear to auscultation bilaterally, respirations unlabored  Chest Wall:    No tenderness  or deformity   Heart:    Regular rate and rhythm, S1 and S2 normal, no murmur, rub   or gallop  Breast Exam:    No tenderness, masses, or nipple abnormality  Abdomen:     Soft, non-tender, bowel sounds active all four quadrants,    no masses, no organomegaly  Genitalia:    Normal female without lesion, discharge or tenderness  0   Extremities:   Extremities normal, atraumatic, no cyanosis or edema  Pulses:   2+ and symmetric all extremities  Skin:   Skin color, texture, turgor normal, no rashes or lesions  Lymph nodes:   Cervical, supraclavicular, and axillary nodes normal  Neurologic:   CNII-XII intact, normal strength, sensation and reflexes    throughout   .    Assessment:    Healthy female exam.    Plan:     Breast self exam technique reviewed and patient encouraged to perform self-exam monthly. Thin prep Pap smear. with cotesting Rec BRCA testing Refill yaz

## 2016-12-08 LAB — CBC
HEMATOCRIT: 36.8 % (ref 35.0–45.0)
Hemoglobin: 11.9 g/dL (ref 11.7–15.5)
MCH: 32.2 pg (ref 27.0–33.0)
MCHC: 32.3 g/dL (ref 32.0–36.0)
MCV: 99.5 fL (ref 80.0–100.0)
MPV: 8.2 fL (ref 7.5–12.5)
Platelets: 248 10*3/uL (ref 140–400)
RBC: 3.7 MIL/uL — ABNORMAL LOW (ref 3.80–5.10)
RDW: 14 % (ref 11.0–15.0)
WBC: 4.6 10*3/uL (ref 3.8–10.8)

## 2016-12-08 LAB — LIPID PANEL
CHOL/HDL RATIO: 2 ratio (ref <5.0)
CHOLESTEROL: 157 mg/dL (ref <200)
HDL: 79 mg/dL (ref >50)
LDL Cholesterol: 62 mg/dL (ref <100)
Triglycerides: 81 mg/dL (ref <150)
VLDL: 16 mg/dL (ref <30)

## 2016-12-08 LAB — COMPREHENSIVE METABOLIC PANEL
ALK PHOS: 40 U/L (ref 33–115)
ALT: 13 U/L (ref 6–29)
AST: 20 U/L (ref 10–30)
Albumin: 4.3 g/dL (ref 3.6–5.1)
BUN: 8 mg/dL (ref 7–25)
CALCIUM: 9.3 mg/dL (ref 8.6–10.2)
CO2: 25 mmol/L (ref 20–31)
Chloride: 102 mmol/L (ref 98–110)
Creat: 0.59 mg/dL (ref 0.50–1.10)
GLUCOSE: 92 mg/dL (ref 65–99)
POTASSIUM: 4.6 mmol/L (ref 3.5–5.3)
Sodium: 136 mmol/L (ref 135–146)
Total Bilirubin: 0.6 mg/dL (ref 0.2–1.2)
Total Protein: 7.3 g/dL (ref 6.1–8.1)

## 2016-12-08 LAB — TSH: TSH: 1.84 mIU/L

## 2016-12-14 LAB — CYTOLOGY - PAP
Diagnosis: NEGATIVE
HPV: NOT DETECTED

## 2016-12-21 ENCOUNTER — Other Ambulatory Visit: Payer: Self-pay | Admitting: Obstetrics & Gynecology

## 2016-12-22 ENCOUNTER — Other Ambulatory Visit: Payer: Self-pay | Admitting: *Deleted

## 2016-12-22 DIAGNOSIS — N946 Dysmenorrhea, unspecified: Secondary | ICD-10-CM

## 2016-12-22 MED ORDER — NAPROXEN 500 MG PO TABS: 500.0000 mg | ORAL_TABLET | Freq: Three times a day (TID) | ORAL | 12 refills | Status: DC | PRN

## 2017-01-11 ENCOUNTER — Ambulatory Visit (INDEPENDENT_AMBULATORY_CARE_PROVIDER_SITE_OTHER): Payer: BLUE CROSS/BLUE SHIELD | Admitting: Physician Assistant

## 2017-01-11 VITALS — BP 122/73 | HR 81 | Temp 98.1°F | Ht 62.0 in | Wt 115.0 lb

## 2017-01-11 DIAGNOSIS — R39198 Other difficulties with micturition: Secondary | ICD-10-CM

## 2017-01-11 DIAGNOSIS — R1031 Right lower quadrant pain: Secondary | ICD-10-CM | POA: Diagnosis not present

## 2017-01-11 DIAGNOSIS — R110 Nausea: Secondary | ICD-10-CM

## 2017-01-11 DIAGNOSIS — R140 Abdominal distension (gaseous): Secondary | ICD-10-CM | POA: Diagnosis not present

## 2017-01-11 LAB — POCT URINALYSIS DIPSTICK
Bilirubin, UA: NEGATIVE
Glucose, UA: NEGATIVE
KETONES UA: NEGATIVE
Leukocytes, UA: NEGATIVE
Nitrite, UA: NEGATIVE
PROTEIN UA: NEGATIVE
RBC UA: NEGATIVE
SPEC GRAV UA: 1.025
Urobilinogen, UA: 0.2
pH, UA: 6.5

## 2017-01-11 LAB — POCT URINE PREGNANCY: Preg Test, Ur: NEGATIVE

## 2017-01-11 NOTE — Progress Notes (Signed)
   Subjective:    Patient ID: Ruth Parker, female    DOB: 10-Jan-1976, 41 y.o.   MRN: [id number]  HPI  Pt is a 41 yo female who presents to the clinic with RLQ pain for last 3 weeks. Describes pain as dull but persistent. She is nauseated but no vomiting. She is having normal bowel movements and frequent at times and loose in nature. Denies any melena or hematochezia. She has had more frequent urination but denies any dysuria. No fever, chills, body aches. She admits to bloating that seems to be worsening.    Review of Systems  All other systems reviewed and are negative.      Objective:   Physical Exam  Constitutional: She is oriented to person, place, and time. She appears well-developed and well-nourished.  Cardiovascular: Normal rate, regular rhythm and normal heart sounds.   Pulmonary/Chest: Effort normal and breath sounds normal.  NO CVA tenderness.   Abdominal:  Distended abdomen with visual bloating in lower quadrant.  Moderate RLQ tenderness to palpation. No guarding or rebound.  Negative psoas sign.   Neurological: She is alert and oriented to person, place, and time.  Psychiatric: She has a normal mood and affect. Her behavior is normal.          Assessment & Plan:  Marland Kitchen.Marland Kitchen.Diagnoses and all orders for this visit:  RLQ abdominal pain -     Urine Culture -     POCT urinalysis dipstick -     CT ABDOMEN PELVIS W CONTRAST; Future  Difficulty in urination -     Urine Culture -     POCT urinalysis dipstick -     CT ABDOMEN PELVIS W CONTRAST; Future  Bloating -     POCT urine pregnancy -     CT ABDOMEN PELVIS W CONTRAST; Future  Nausea -     POCT urine pregnancy -     CT ABDOMEN PELVIS W CONTRAST; Future   .Marland Kitchen. Results for orders placed or performed in visit on 01/11/17  POCT urinalysis dipstick  Result Value Ref Range   Color, UA yellow    Clarity, UA clear    Glucose, UA neg    Bilirubin, UA neg    Ketones, UA neg    Spec Grav, UA 1.025    Blood, UA neg    pH, UA 6.5    Protein, UA neg    Urobilinogen, UA 0.2    Nitrite, UA neg    Leukocytes, UA Negative Negative  POCT urine pregnancy  Result Value Ref Range   Preg Test, Ur Negative Negative   Does not appear to be UTI. Will culture.  Negative for pregnancy.   Pt is having regular bowel movements not as concerning for constipation.  Concerned with visual bloating and tenderness.  Have to be concerned with diverticulitis vs ovarian cysts vs uterine fibroid.  Will get CT scan.

## 2017-01-11 NOTE — Progress Notes (Signed)
Call pt: negative for pregnancy and urinalysis looks perfect.

## 2017-01-11 NOTE — Patient Instructions (Signed)
Will CT scan of abdomen.

## 2017-01-12 DIAGNOSIS — R39198 Other difficulties with micturition: Secondary | ICD-10-CM | POA: Insufficient documentation

## 2017-01-12 DIAGNOSIS — R110 Nausea: Secondary | ICD-10-CM | POA: Insufficient documentation

## 2017-01-12 DIAGNOSIS — R140 Abdominal distension (gaseous): Secondary | ICD-10-CM | POA: Insufficient documentation

## 2017-01-12 DIAGNOSIS — R1031 Right lower quadrant pain: Secondary | ICD-10-CM | POA: Insufficient documentation

## 2017-01-13 LAB — URINE CULTURE: Organism ID, Bacteria: NO GROWTH

## 2017-03-08 ENCOUNTER — Ambulatory Visit (INDEPENDENT_AMBULATORY_CARE_PROVIDER_SITE_OTHER): Payer: BLUE CROSS/BLUE SHIELD | Admitting: Family Medicine

## 2017-03-08 ENCOUNTER — Encounter: Payer: Self-pay | Admitting: Family Medicine

## 2017-03-08 ENCOUNTER — Telehealth: Payer: Self-pay

## 2017-03-08 ENCOUNTER — Other Ambulatory Visit: Payer: Self-pay | Admitting: Family Medicine

## 2017-03-08 VITALS — BP 118/67 | HR 80 | Temp 98.0°F | Ht 62.0 in | Wt 113.0 lb

## 2017-03-08 DIAGNOSIS — J301 Allergic rhinitis due to pollen: Secondary | ICD-10-CM

## 2017-03-08 DIAGNOSIS — J0100 Acute maxillary sinusitis, unspecified: Secondary | ICD-10-CM

## 2017-03-08 MED ORDER — FLUTICASONE PROPIONATE 50 MCG/ACT NA SUSP: 2.0000 | Freq: Every day | NASAL | 99 refills | Status: DC

## 2017-03-08 MED ORDER — AMOXICILLIN-POT CLAVULANATE 875-125 MG PO TABS: 1.0000 | ORAL_TABLET | Freq: Two times a day (BID) | ORAL | 0 refills | Status: DC

## 2017-03-08 MED ORDER — OLOPATADINE HCL 0.1 % OP SOLN: 1.0000 [drp] | Freq: Two times a day (BID) | OPHTHALMIC | 99 refills | Status: DC

## 2017-03-08 MED ORDER — PSEUDOEPHEDRINE HCL ER 120 MG PO TB12: 120.0000 mg | ORAL_TABLET | Freq: Every day | ORAL | 1 refills | Status: DC | PRN

## 2017-03-08 MED ORDER — PREDNISONE 20 MG PO TABS: 40.0000 mg | ORAL_TABLET | Freq: Every day | ORAL | 0 refills | Status: DC

## 2017-03-08 NOTE — Telephone Encounter (Signed)
OK, rx sent to [facility]  

## 2017-03-08 NOTE — Progress Notes (Signed)
   Subjective:    Patient ID: Ruth Parker, female    DOB: [birth year], 41 y.o.   MRN: [id number]  HPI 41 year old female comes in today with 3 weeks of sinus congestion, facial pressure, headache. She's been mostly using Sudafed, Flonase and Allegra thinking it could be allergies. Used to be on allergy shots years ago.  Says now getting right maxillary facial pressure and ST.  NO fever, chills or sweats.     Review of Systems     Objective:   Physical Exam  Constitutional: She is oriented to person, place, and time. She appears well-developed and well-nourished.  HENT:  Head: Normocephalic and atraumatic.  Right Ear: External ear normal.  Left Ear: External ear normal.  Nose: Nose normal.  Mouth/Throat: Oropharynx is clear and moist.  TMs and canals are clear.   Eyes: Conjunctivae and EOM are normal. Pupils are equal, round, and reactive to light.  Neck: Neck supple. No thyromegaly present.  Tender swollen right anterior cervical LN.   Cardiovascular: Normal rate, regular rhythm and normal heart sounds.   Pulmonary/Chest: Effort normal and breath sounds normal. She has no wheezes.  Lymphadenopathy:    She has no cervical adenopathy.  Neurological: She is alert and oriented to person, place, and time.  Skin: Skin is warm and dry.  Psychiatric: She has a normal mood and affect.       Assessment & Plan:  Acute sinusitis-we'll treat with Augmentin and a 5 day course of prednisone since I do think her allergies are significant component to this. Call if not significantly better in one week.  Allergic rhinitis-severe symptoms-treat with 5 days of prednisone. Continue with Flonase and Allegra. She was okay to add Benadryl at bedtime to help her sleep. That's perfectly fine. It may just cause some increased dry mouth.

## 2017-03-08 NOTE — Patient Instructions (Addendum)

## 2017-03-08 NOTE — Telephone Encounter (Signed)
Could you write a script for flonase, sudafed, and pantanol eye drops.  Pt has a prepaid card that she can use for OTC meds, but the pharmacy needs a script to use the card.  Please advise.

## 2017-03-09 NOTE — Telephone Encounter (Signed)
Notified patient.

## 2017-03-30 ENCOUNTER — Ambulatory Visit (INDEPENDENT_AMBULATORY_CARE_PROVIDER_SITE_OTHER): Payer: BLUE CROSS/BLUE SHIELD

## 2017-03-30 DIAGNOSIS — R1031 Right lower quadrant pain: Secondary | ICD-10-CM

## 2017-03-30 DIAGNOSIS — R140 Abdominal distension (gaseous): Secondary | ICD-10-CM

## 2017-03-30 MED ORDER — IOPAMIDOL (ISOVUE-300) INJECTION 61%
100.0000 mL | Freq: Once | INTRAVENOUS | Status: AC | PRN
  Administered 2017-03-30: 100 mL via INTRAVENOUS

## 2017-03-30 NOTE — Progress Notes (Signed)
Call pt: CT scan is unremarkable.

## 2017-05-12 IMAGING — CT CT ABD-PELV W/ CM
2 of 5 series · 15 of 46 positions shown, 17 images · IV contrast (APPLIED)
Comparison: None.

CLINICAL DATA: Right lower quadrant pain end tenderness.  Bloating.

EXAM:
CT ABDOMEN AND PELVIS WITH CONTRAST
TECHNIQUE: Multidetector CT imaging of the abdomen and pelvis was performed
using the standard protocol following bolus administration of
intravenous contrast.
CONTRAST:  100mL CNSJER-0SS IOPAMIDOL (CNSJER-0SS) INJECTION 61%

[Series 2: axial st · axial · 0.59mm/px · z∈[+647,+1067]mm · 12 of 94 slices shown, 14 images]
[im 5/94  soft-tissue]
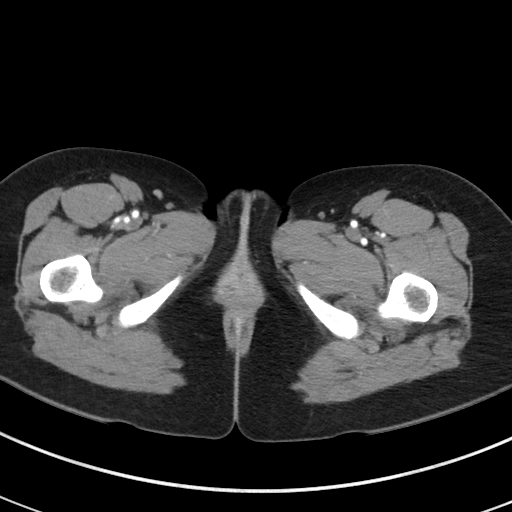
[im 5/94  bone]
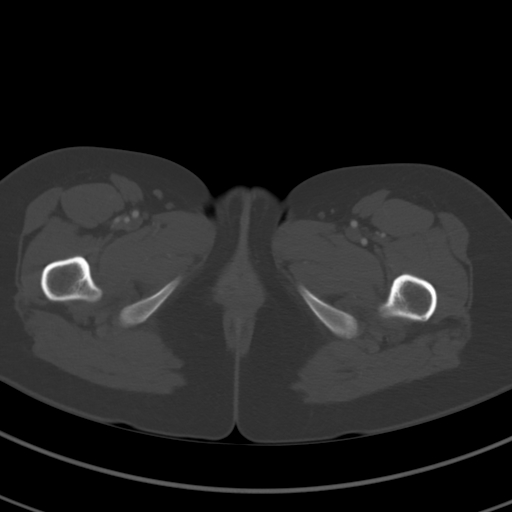
[im 15/94  soft-tissue]
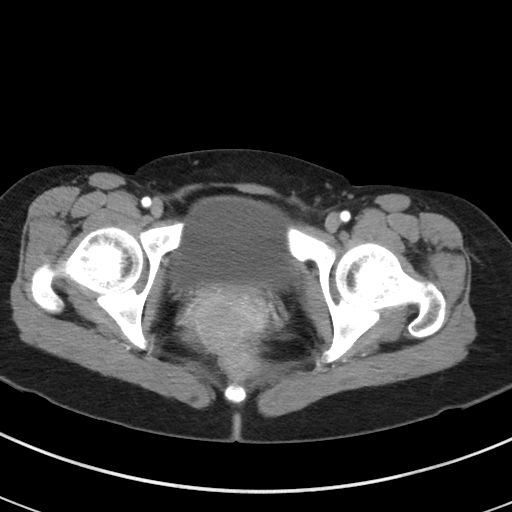
[im 20/94  soft-tissue]
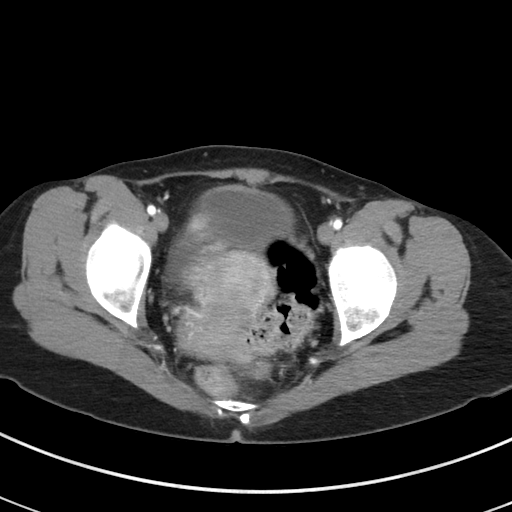
[im 30/94  soft-tissue]
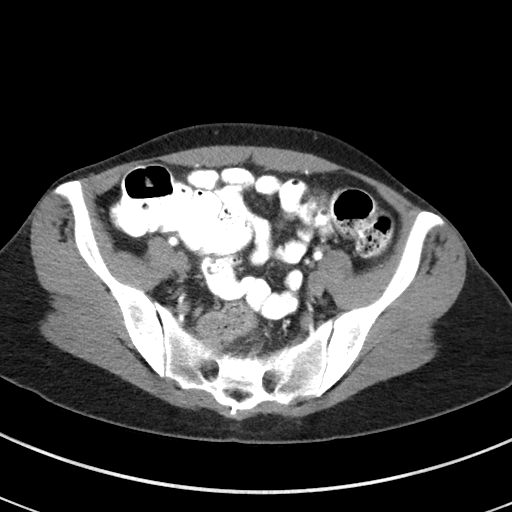
[im 35/94  soft-tissue]
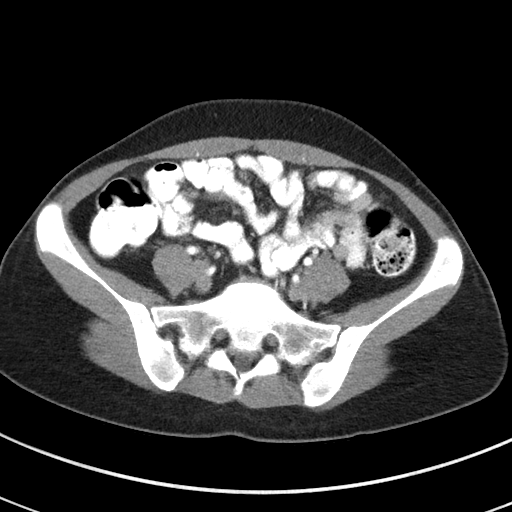
[im 45/94  soft-tissue]
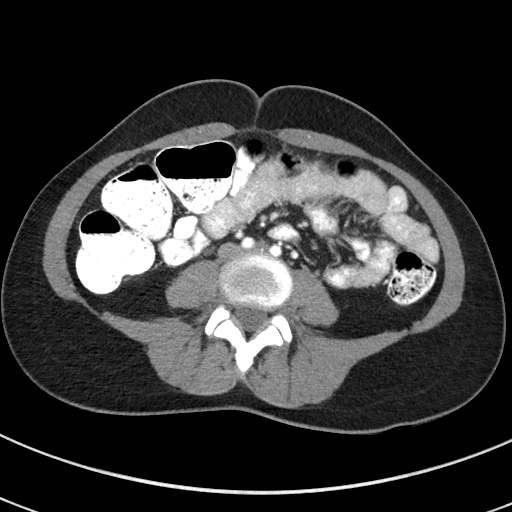
[im 49/94  soft-tissue]
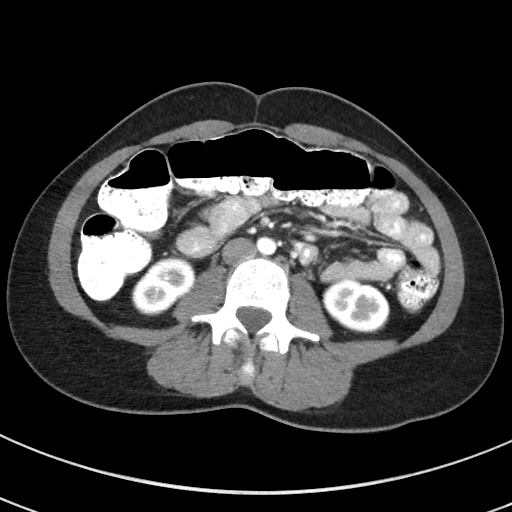
[im 59/94  soft-tissue]
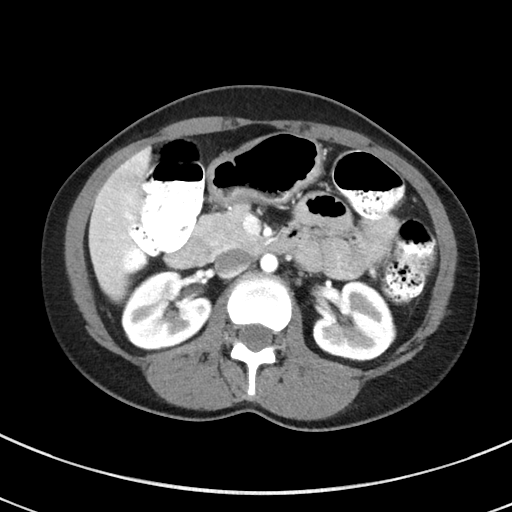
[im 64/94  soft-tissue]
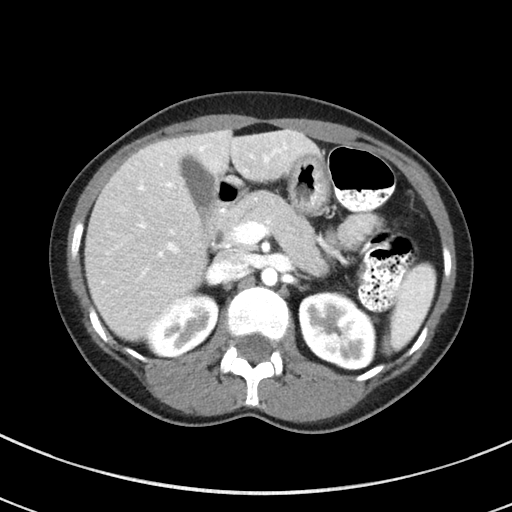
[im 64/94  bone]
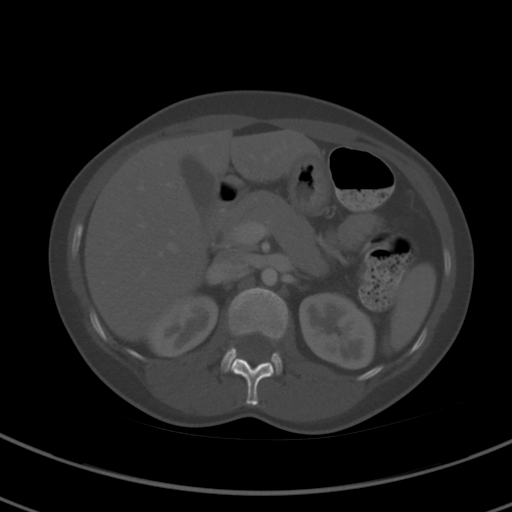
[im 74/94  soft-tissue]
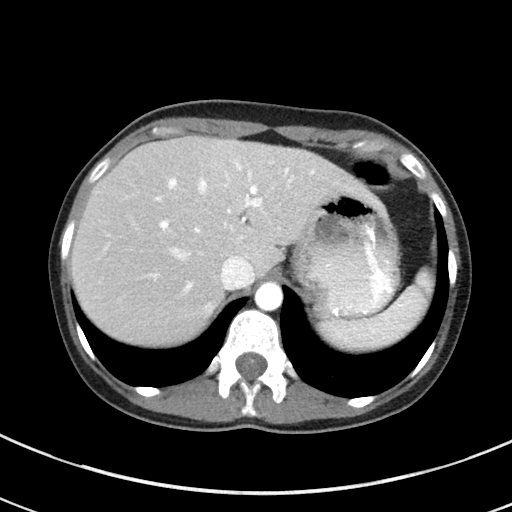
[im 79/94  soft-tissue]
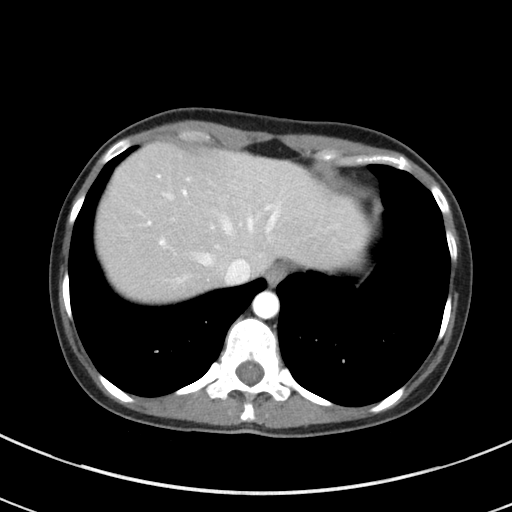
[im 89/94  soft-tissue]
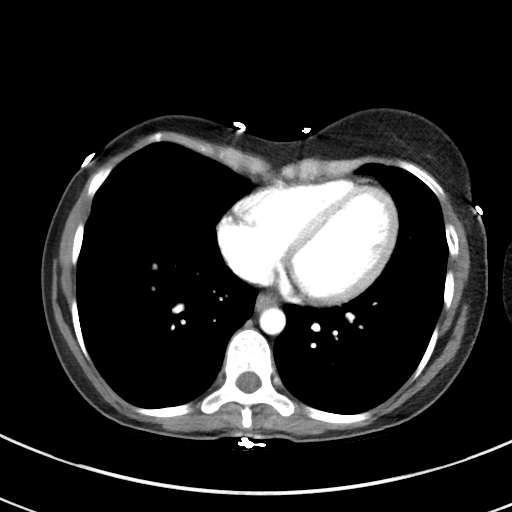

[Series 4: coronal st · coronal · 0.67mm/px · 3 of 71 slices shown]
[im 24/71  soft-tissue]
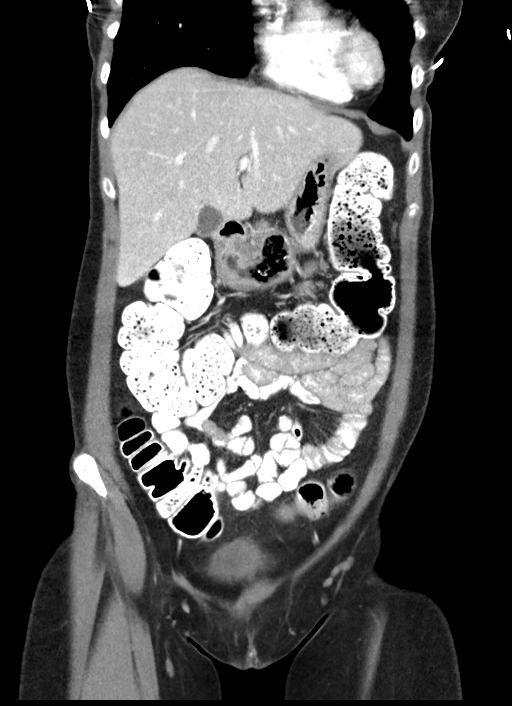
[im 32/71  soft-tissue]
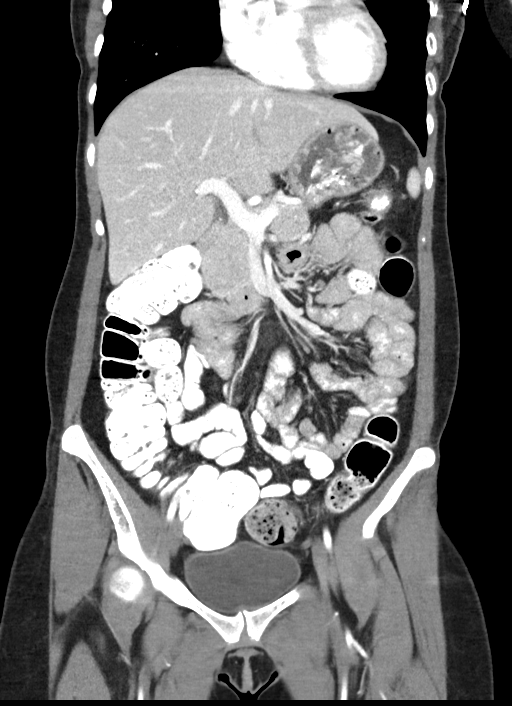
[im 39/71  soft-tissue]
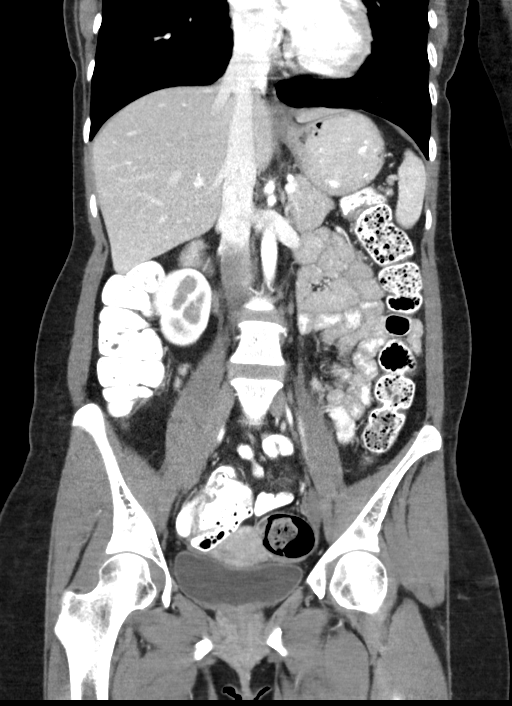

[15 of 46 positions shown; findings below may reference images not displayed]

FINDINGS: Lower chest:  Unremarkable.

Hepatobiliary: No focal abnormality within the liver parenchyma.
There is no evidence for gallstones, gallbladder wall thickening, or
pericholecystic fluid. No intrahepatic or extrahepatic biliary
dilation.

Pancreas: No focal mass lesion. No dilatation of the main duct. No
intraparenchymal cyst. No peripancreatic edema.

Spleen: No splenomegaly. No focal mass lesion.

Adrenals/Urinary Tract: No adrenal nodule or mass. 5 mm well-defined
low-density lesion lower pole left kidney too small to characterize
but likely a cyst. Right kidney unremarkable. No evidence for
hydroureter. The urinary bladder appears normal for the degree of
distention.

Stomach/Bowel: Stomach is nondistended. No gastric wall thickening.
No evidence of outlet obstruction. Duodenum is normally positioned
as is the ligament of Treitz. No small bowel wall thickening. No
small bowel dilatation. The terminal ileum is normal. The appendix
is normal. No gross colonic mass. No colonic wall thickening. No
substantial diverticular change.

Vascular/Lymphatic: No abdominal aortic aneurysm. No abdominal
aortic atherosclerotic calcification. There is no gastrohepatic or
hepatoduodenal ligament lymphadenopathy. No intraperitoneal or
retroperitoneal lymphadenopathy. No pelvic sidewall lymphadenopathy.

Reproductive: The uterus has normal CT imaging appearance. There is
no adnexal mass.

Other: No intraperitoneal free fluid.

Musculoskeletal: Bone windows reveal no worrisome lytic or sclerotic
osseous lesions.
IMPRESSION: 1. No acute findings in the abdomen or pelvis. Specifically, no
findings to explain the patient's history of right lower quadrant
pain.

## 2017-05-29 ENCOUNTER — Other Ambulatory Visit: Payer: Self-pay | Admitting: Obstetrics & Gynecology

## 2017-11-28 ENCOUNTER — Other Ambulatory Visit: Payer: Self-pay | Admitting: Obstetrics & Gynecology

## 2017-11-29 ENCOUNTER — Other Ambulatory Visit: Payer: BLUE CROSS/BLUE SHIELD

## 2017-11-29 DIAGNOSIS — N898 Other specified noninflammatory disorders of vagina: Secondary | ICD-10-CM

## 2017-11-30 LAB — CERVICOVAGINAL ANCILLARY ONLY
BACTERIAL VAGINITIS: NEGATIVE
CANDIDA VAGINITIS: NEGATIVE

## 2018-02-21 ENCOUNTER — Other Ambulatory Visit: Payer: Self-pay | Admitting: Obstetrics & Gynecology

## 2018-02-21 DIAGNOSIS — N946 Dysmenorrhea, unspecified: Secondary | ICD-10-CM

## 2018-02-22 DIAGNOSIS — J309 Allergic rhinitis, unspecified: Secondary | ICD-10-CM | POA: Diagnosis not present

## 2018-02-23 ENCOUNTER — Other Ambulatory Visit: Payer: Self-pay | Admitting: Obstetrics & Gynecology

## 2018-03-01 ENCOUNTER — Encounter: Payer: Self-pay | Admitting: Physician Assistant

## 2018-03-01 ENCOUNTER — Ambulatory Visit (INDEPENDENT_AMBULATORY_CARE_PROVIDER_SITE_OTHER): Payer: BLUE CROSS/BLUE SHIELD | Admitting: Physician Assistant

## 2018-03-01 VITALS — BP 121/77 | HR 83 | Temp 98.3°F | Wt 110.0 lb

## 2018-03-01 DIAGNOSIS — H6993 Unspecified Eustachian tube disorder, bilateral: Secondary | ICD-10-CM | POA: Diagnosis not present

## 2018-03-01 DIAGNOSIS — J342 Deviated nasal septum: Secondary | ICD-10-CM

## 2018-03-01 DIAGNOSIS — J0100 Acute maxillary sinusitis, unspecified: Secondary | ICD-10-CM | POA: Diagnosis not present

## 2018-03-01 DIAGNOSIS — J309 Allergic rhinitis, unspecified: Secondary | ICD-10-CM | POA: Insufficient documentation

## 2018-03-01 MED ORDER — MONTELUKAST SODIUM 10 MG PO TABS: 10.0000 mg | ORAL_TABLET | Freq: Every day | ORAL | 3 refills | Status: DC

## 2018-03-01 MED ORDER — CEFUROXIME AXETIL 250 MG PO TABS: 250.0000 mg | ORAL_TABLET | Freq: Two times a day (BID) | ORAL | 0 refills | Status: AC

## 2018-03-01 NOTE — Patient Instructions (Addendum)
- Continue Allegra and Flonase - Add Singulair (Montelukast) in the evenings. Continue this for the remainder of allergy season - Netti pot three times daily. Recommend rinsing prior to using your Flonase - Antibiotic twice daily for 10 days    Sinusitis, Adult Sinusitis is soreness and inflammation of your sinuses. Sinuses are hollow spaces in the bones around your face. Your sinuses are located:  Around your eyes.  In the middle of your forehead.  Behind your nose.  In your cheekbones.  Your sinuses and nasal passages are lined with a stringy fluid (mucus). Mucus normally drains out of your sinuses. When your nasal tissues become inflamed or swollen, the mucus can become trapped or blocked so air cannot flow through your sinuses. This allows bacteria, viruses, and funguses to grow, which leads to infection. Sinusitis can develop quickly and last for 7?10 days (acute) or for more than 12 weeks (chronic). Sinusitis often develops after a cold. What are the causes? This condition is caused by anything that creates swelling in the sinuses or stops mucus from draining, including:  Allergies.  Asthma.  Bacterial or viral infection.  Abnormally shaped bones between the nasal passages.  Nasal growths that contain mucus (nasal polyps).  Narrow sinus openings.  Pollutants, such as chemicals or irritants in the air.  A foreign object stuck in the nose.  A fungal infection. This is rare.  What increases the risk? The following factors may make you more likely to develop this condition:  Having allergies or asthma.  Having had a recent cold or respiratory tract infection.  Having structural deformities or blockages in your nose or sinuses.  Having a weak immune system.  Doing a lot of swimming or diving.  Overusing nasal sprays.  Smoking.  What are the signs or symptoms? The main symptoms of this condition are pain and a feeling of pressure around the affected sinuses.  Other symptoms include:  Upper toothache.  Earache.  Headache.  Bad breath.  Decreased sense of smell and taste.  A cough that may get worse at night.  Fatigue.  Fever.  Thick drainage from your nose. The drainage is often green and it may contain pus (purulent).  Stuffy nose or congestion.  Postnasal drip. This is when extra mucus collects in the throat or back of the nose.  Swelling and warmth over the affected sinuses.  Sore throat.  Sensitivity to light.  How is this diagnosed? This condition is diagnosed based on symptoms, a medical history, and a physical exam. To find out if your condition is acute or chronic, your health care provider may:  Look in your nose for signs of nasal polyps.  Tap over the affected sinus to check for signs of infection.  View the inside of your sinuses using an imaging device that has a light attached (endoscope).  If your health care provider suspects that you have chronic sinusitis, you may also:  Be tested for allergies.  Have a sample of mucus taken from your nose (nasal culture) and checked for bacteria.  Have a mucus sample examined to see if your sinusitis is related to an allergy.  If your sinusitis does not respond to treatment and it lasts longer than 8 weeks, you may have an MRI or CT scan to check your sinuses. These scans also help to determine how severe your infection is. In rare cases, a bone biopsy may be done to rule out more serious types of fungal sinus disease. How is this  treated? Treatment for sinusitis depends on the cause and whether your condition is chronic or acute. If a virus is causing your sinusitis, your symptoms will go away on their own within 10 days. You may be given medicines to relieve your symptoms, including:  Topical nasal decongestants. They shrink swollen nasal passages and let mucus drain from your sinuses.  Antihistamines. These drugs block inflammation that is triggered by allergies.  This can help to ease swelling in your nose and sinuses.  Topical nasal corticosteroids. These are nasal sprays that ease inflammation and swelling in your nose and sinuses.  Nasal saline washes. These rinses can help to get rid of thick mucus in your nose.  If your condition is caused by bacteria, you will be given an antibiotic medicine. If your condition is caused by a fungus, you will be given an antifungal medicine. Surgery may be needed to correct underlying conditions, such as narrow nasal passages. Surgery may also be needed to remove polyps. Follow these instructions at home: Medicines  Take, use, or apply over-the-counter and prescription medicines only as told by your health care provider. These may include nasal sprays.  If you were prescribed an antibiotic medicine, take it as told by your health care provider. Do not stop taking the antibiotic even if you start to feel better. Hydrate and Humidify  Drink enough water to keep your urine clear or pale yellow. Staying hydrated will help to thin your mucus.  Use a cool mist humidifier to keep the humidity level in your home above 50%.  Inhale steam for 10-15 minutes, 3-4 times a day or as told by your health care provider. You can do this in the bathroom while a hot shower is running.  Limit your exposure to cool or dry air. Rest  Rest as much as possible.  Sleep with your head raised (elevated).  Make sure to get enough sleep each night. General instructions  Apply a warm, moist washcloth to your face 3-4 times a day or as told by your health care provider. This will help with discomfort.  Wash your hands often with soap and water to reduce your exposure to viruses and other germs. If soap and water are not available, use hand sanitizer.  Do not smoke. Avoid being around people who are smoking (secondhand smoke).  Keep all follow-up visits as told by your health care provider. This is important. Contact a health care  provider if:  You have a fever.  Your symptoms get worse.  Your symptoms do not improve within 10 days. Get help right away if:  You have a severe headache.  You have persistent vomiting.  You have pain or swelling around your face or eyes.  You have vision problems.  You develop confusion.  Your neck is stiff.  You have trouble breathing. This information is not intended to replace advice given to you by your health care provider. Make sure you discuss any questions you have with your health care provider. Document Released: 11/08/2005 Document Revised: 07/04/2016 Document Reviewed: 09/03/2015 Elsevier Interactive Patient Education  [employer].

## 2018-03-01 NOTE — Progress Notes (Signed)
HPI:                                                                Ruth Parker is a 42 y.o. female [facility] today for sinus problem  Sinusitis  This is a new problem. The current episode started 1 to 4 weeks ago. The problem is unchanged. There has been no fever. The pain is moderate. Associated symptoms include congestion, coughing, ear pain (intermittent), a hoarse voice and sinus pressure (right maxillary). Past treatments include oral decongestants and spray decongestants (steroids). The treatment provided mild relief.  Taking allegra and Flonase for seasonal allergies.  No flowsheet data found.  No flowsheet data found.    Past Medical History:  Diagnosis Date  . Allergy   . Anemia 03/02/10  . History of migraines    Seeing neurologist  . Thyroid disease    abn labs by pcp  . Vaginal Pap smear, abnormal    Past Surgical History:  Procedure Laterality Date  . DILATION AND CURETTAGE OF UTERUS     elective AB   Social History   Tobacco Use  . Smoking status: Never Smoker  . Smokeless tobacco: Never Used  Substance Use Topics  . Alcohol use: Yes    Alcohol/week: 0.0 oz    Comment: Occasionally   family history includes Breast cancer in her maternal aunt and maternal grandmother; Cancer in her maternal aunt and maternal grandmother; Diabetes in her maternal grandfather and paternal grandfather; Glaucoma in her mother; Hypertension in her mother; Stroke in her maternal grandfather, paternal grandfather, and paternal grandmother.    ROS: negative except as noted in the HPI  Medications: Current Outpatient Medications  Medication Sig Dispense Refill  . acetaminophen (TYLENOL) 325 MG tablet Take 650 mg by mouth every 6 (six) hours as needed.      . fexofenadine (ALLEGRA) 180 MG tablet Take 180 mg by mouth daily.      . fluticasone (FLONASE) 50 MCG/ACT nasal spray Place 2 sprays into both nostrils  daily. 16 g PRN  . LORYNA 3-0.02 MG tablet TAKE 1 TABLET BY MOUTH EVERY DAY 84 tablet 0  . naproxen (NAPROSYN) 500 MG tablet TAKE 1 TABLET BY MOUTH THREE TIMES A DAY AS NEEDED 100 tablet 2  . NIKKI 3-0.02 MG tablet TAKE 1 TABLET EVERY DAY 84 tablet 0  . olopatadine (PATANOL) 0.1 % ophthalmic solution Place 1 drop into both eyes 2 (two) times daily. 5 mL PRN  . predniSONE (DELTASONE) 20 MG tablet Take 2 tablets (40 mg total) by mouth daily. 10 tablet 0  . pseudoephedrine (SUDAFED 12 HOUR) 120 MG 12 hr tablet Take 1 tablet (120 mg total) by mouth daily as needed for congestion. 30 tablet 1   No current facility-administered medications for this visit.    Allergies  Allergen Reactions  . Percocet [Oxycodone-Acetaminophen] Nausea Only       Objective:  BP 121/77   Pulse 83   Temp 98.3 F (36.8 C) (Oral)   Wt 110 lb (49.9 kg)   BMI 20.12 kg/m  Gen:  alert, not ill-appearing, no distress, appropriate for age HEENT: head normocephalic without obvious abnormality, conjunctiva and cornea clear, TM's with serous effusion and air fluid  levels bilaterally, right maxillary sinus tenderness, deviated nasal septum, nasal mucosa pink, oropharynx clear, moist mucous membranes, neck supple, there is posterior cervical adenopathy, trachea midline Pulm: Normal work of breathing, normal phonation, clear to auscultation bilaterally, no wheezes, rales or rhonchi CV: Normal rate, regular rhythm, s1 and s2 distinct, no murmurs, clicks or rubs  Neuro: alert and oriented x 3, no tremor MSK: extremities atraumatic, normal gait and station Skin: intact, no rashes on exposed skin, no jaundice, no cyanosis   No results found for this or any previous visit (from the past 72 hour(s)). No results found.    Assessment and Plan: 42 y.o. female with   Acute sinusitis, Allergic rhinitis - counseled on likely viral nature of this, especially in the first 10 days of symptoms with known allergic rhinitis. She  does report worsening of her symptoms after 7 days. Going to cover her with Ceftin. Encouraged nasal saline irrigation 3-4 times daily. Cont Flonase and Allegra. Add Singulair. Consider ENT follow-up if symptoms persist or recur due to deviated septum.  Acute non-recurrent maxillary sinusitis - Plan: cefUROXime (CEFTIN) 250 MG tablet  Allergic rhinitis, unspecified seasonality, unspecified trigger - Plan: montelukast (SINGULAIR) 10 MG tablet  Eustachian tube disorder, bilateral  Deviated septum   Patient education and anticipatory guidance given Patient agrees with treatment plan Follow-up as needed if symptoms worsen or fail to improve  Levonne Hubert PA-C

## 2018-03-02 ENCOUNTER — Encounter: Payer: Self-pay | Admitting: Physician Assistant

## 2018-03-02 DIAGNOSIS — J342 Deviated nasal septum: Secondary | ICD-10-CM | POA: Insufficient documentation

## 2018-05-15 ENCOUNTER — Other Ambulatory Visit: Payer: Self-pay | Admitting: Obstetrics & Gynecology

## 2018-08-08 ENCOUNTER — Other Ambulatory Visit: Payer: Self-pay | Admitting: Obstetrics & Gynecology

## 2018-10-29 ENCOUNTER — Other Ambulatory Visit: Payer: Self-pay | Admitting: Obstetrics & Gynecology

## 2019-01-18 ENCOUNTER — Other Ambulatory Visit: Payer: Self-pay | Admitting: Obstetrics & Gynecology

## 2019-04-08 ENCOUNTER — Other Ambulatory Visit: Payer: Self-pay | Admitting: Obstetrics & Gynecology

## 2019-05-08 ENCOUNTER — Other Ambulatory Visit: Payer: BC Managed Care – PPO

## 2019-05-08 ENCOUNTER — Ambulatory Visit (INDEPENDENT_AMBULATORY_CARE_PROVIDER_SITE_OTHER): Payer: BC Managed Care – PPO | Admitting: Family Medicine

## 2019-05-08 ENCOUNTER — Encounter: Payer: Self-pay | Admitting: Family Medicine

## 2019-05-08 ENCOUNTER — Telehealth: Payer: Self-pay | Admitting: General Practice

## 2019-05-08 ENCOUNTER — Encounter (INDEPENDENT_AMBULATORY_CARE_PROVIDER_SITE_OTHER): Payer: Self-pay

## 2019-05-08 VITALS — Temp 97.0°F | Ht 62.0 in | Wt 115.0 lb

## 2019-05-08 DIAGNOSIS — R6889 Other general symptoms and signs: Secondary | ICD-10-CM

## 2019-05-08 DIAGNOSIS — H938X3 Other specified disorders of ear, bilateral: Secondary | ICD-10-CM

## 2019-05-08 DIAGNOSIS — R110 Nausea: Secondary | ICD-10-CM

## 2019-05-08 DIAGNOSIS — R05 Cough: Secondary | ICD-10-CM | POA: Diagnosis not present

## 2019-05-08 DIAGNOSIS — Z20822 Contact with and (suspected) exposure to covid-19: Secondary | ICD-10-CM

## 2019-05-08 DIAGNOSIS — J029 Acute pharyngitis, unspecified: Secondary | ICD-10-CM | POA: Diagnosis not present

## 2019-05-08 DIAGNOSIS — R059 Cough, unspecified: Secondary | ICD-10-CM

## 2019-05-08 NOTE — Progress Notes (Signed)
Virtual Visit via Video Note  I connected with Ruth Parker on 05/08/19 at 11:30 AM EDT by a video enabled telemedicine application and verified that I am speaking with the correct person using two identifiers.   I discussed the limitations of evaluation and management by telemedicine and the availability of in person appointments. The patient expressed understanding and agreed to proceed.  Patient was in her car and I was in my office for this virtual visit.  Subjective:    CC: HA, ear pain  HPI:  Pt reports that the headache and ear pain x 3 days, it comes and goes. It has gotten worse. + post nasal drip and has been coughing a lot.  She does not usually have a history of ear infections but describes it as just a fullness and pressure in both ears.  The intensity comes and goes.  Denies f/s/d/v. She has had some chills and feels some nausea but believes that this is associated w/post nasal drip.  Pt works in [employer] and wears a mask and has her temp checked prior to work shift.  She has not run a fever so far.  There is someone out at work who is waiting for their test results but she is not sure if they have heard back and if they are positive for an MI.  She has not been in close contact with that other coworker.  HA is mostly frontal. She has bad allergies and she has been outside. Hx of sinus infections.  Mild ST and some swollen LN in her neck, anteriorly.  + nausea, no vomiting. A few loose BMs.    Past medical history, Surgical history, Family history not pertinant except as noted below, Social history, Allergies, and medications have been entered into the medical record, reviewed, and corrections made.   Review of Systems: No fevers, chills, night sweats, weight loss, chest pain, or shortness of breath.   Objective:    General: Speaking clearly in complete sentences without any shortness of breath.  Alert and oriented x3.  Normal judgment. No apparent acute distress.   Well-groomed.  No increased work of breathing.    Impression and Recommendations:   Sore throat/cough-possible upper respiratory infection versus sinusitis versus severe allergies versus possible COVID.  Being that she does work in [employer] I think she should get tested and take extra precautions.  Did warn that she would have to self quarantine while waiting her test results.  Also encouraged her to go ahead and restart her Flonase and Allegra in the meantime.  Certainly if she gets worse she can give [place] a call back or if she develops fever.  Will send over work note for her to be out for at least the next 3 days and can continue that note depending on the results.    I discussed the assessment and treatment plan with the patient. The patient was provided an opportunity to ask questions and all were answered. The patient agreed with the plan and demonstrated an understanding of the instructions.   The patient was advised to call back or seek an in-person evaluation if the symptoms worsen or if the condition fails to improve as anticipated.   Beatrice Lecher, MD

## 2019-05-08 NOTE — Progress Notes (Signed)
Pt reports that the headache and ear pain it comes and goes. X 3 days but has gotten worse. She reports that she has a post nasal drip and has been coughing a lot.   Denies f/s/d/v. She has had some chills and feels some nausea but believes that this is associated w/post nasal drip.  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

## 2019-05-08 NOTE — Addendum Note (Signed)
Addended by: Dimple Nanas on: 05/08/2019 12:33 PM   Modules accepted: Orders

## 2019-05-08 NOTE — Telephone Encounter (Signed)
-----   Message from Hali Marry, MD sent at 05/08/2019 11:52 AM EDT ----- Regarding: needs testing for COVID. Please call pt to test for her COVID.  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  She has had 3 days of sore throat cough and ear pain with no fever.  Please call patient to schedule for testing.

## 2019-05-08 NOTE — Telephone Encounter (Signed)
Pt has been scheduled for Covid-19 testing.   Scheduled appt with pt directly.  Pt is scheduled for [facility] testing site  Pt was referred by: Hali Marry, MD

## 2019-05-09 ENCOUNTER — Encounter (INDEPENDENT_AMBULATORY_CARE_PROVIDER_SITE_OTHER): Payer: Self-pay

## 2019-05-09 ENCOUNTER — Telehealth: Payer: Self-pay

## 2019-05-09 NOTE — Telephone Encounter (Signed)
[facility] Courtesy call to patient re her questionnaire survey responses. Left detailed message for patient to contact [facility] for further follow up.

## 2019-05-10 ENCOUNTER — Telehealth (INDEPENDENT_AMBULATORY_CARE_PROVIDER_SITE_OTHER): Payer: BC Managed Care – PPO | Admitting: Family Medicine

## 2019-05-10 ENCOUNTER — Telehealth: Payer: Self-pay

## 2019-05-10 ENCOUNTER — Encounter: Payer: Self-pay | Admitting: Family Medicine

## 2019-05-10 VITALS — Temp 98.0°F

## 2019-05-10 DIAGNOSIS — J0100 Acute maxillary sinusitis, unspecified: Secondary | ICD-10-CM | POA: Diagnosis not present

## 2019-05-10 LAB — NOVEL CORONAVIRUS, NAA: SARS-CoV-2, NAA: NOT DETECTED

## 2019-05-10 MED ORDER — ONDANSETRON 4 MG PO TBDP: 4.0000 mg | ORAL_TABLET | Freq: Three times a day (TID) | ORAL | 0 refills | Status: DC | PRN

## 2019-05-10 MED ORDER — AMOXICILLIN-POT CLAVULANATE 875-125 MG PO TABS: 1.0000 | ORAL_TABLET | Freq: Two times a day (BID) | ORAL | 0 refills | Status: DC

## 2019-05-10 NOTE — Telephone Encounter (Signed)
Patient returned call -   Patient reports dry cough - not that bad - only gets worse at night; eating just half of her meals- no nausea/vomiting; drinking plenty of fluids.   Suggested the following:  For cough: hard candy or cough drops; drinking warm liquids; OTC cough medication.  If cough persists or gets worse with no relief; cough becomes productive with yellow/green sputum contact her PCP .  For appetite: encouraged to continue drinking fluids as tolerated; eating only half of meals is okay with no nausea/vomitng.   Patient requested note for employment - advised to contact PCP.  Patient stated she understood all advice/suggestions/instructions and had no further questions.

## 2019-05-10 NOTE — Progress Notes (Signed)
Pt reports that the L side of her face is hurting. She has decreased appetite. Still has a cough that bothers her more at night. She continues to take the Allegra and Flonase. She also reports having some light headedness on and off.  She has some sweats and chills.some nausea no vomiting or diarrhea.    Ruth Parker, Ruth Parker, CMA

## 2019-05-10 NOTE — Telephone Encounter (Signed)
Ruth Parker states she had a COVID-19 test and it came back negative. She is still has facial pain, ear pain, cough and post nasal drip. She has been scheduled for this afternoon for a MyChart visit. She wanted to directly speak with Dr Madilyn Fireman.

## 2019-05-10 NOTE — Progress Notes (Signed)
  Virtual Visit via Video Note  I connected with Ruth Parker on 05/10/19 at  3:20 PM EDT by a video enabled telemedicine application and verified that I am speaking with the correct person using two identifiers.   I discussed the limitations of evaluation and management by telemedicine and the availability of in person appointments. The patient expressed understanding and agreed to proceed.  Pt was at home and I was in my office for the virtual visit.     Subjective:    CC: still not feeling well.   HPI: I saw Ruth Parker on June 16 for sore throat and cough.  She has been sick for 5 days total now.  Initially I was very concerned about potential for COVID as she works in [employer].  Ruth Parker states she had a COVID-19 test and it came back negative. She is still has facial pain, ear pain, cough and post nasal drip. Pt reports that the L side of her face is hurting. She has decreased appetite. Still has a cough that bothers her more at night. She continues to take the Allegra and flonase. She also reports having some light headedness on and off. + nausea.  Facial pain is worse is she bends forward.   She has some sweats and chills.some nausea no vomiting or diarrhea.     Past medical history, Surgical history, Family history not pertinant except as noted below, Social history, Allergies, and medications have been entered into the medical record, reviewed, and corrections made.   Review of Systems: No fevers, chills, night sweats, weight loss, chest pain, or shortness of breath.   Objective:    General: Speaking clearly in complete sentences without any shortness of breath.  Alert and oriented x3.  Normal judgment. No apparent acute distress. Tender when she pressed on her left facial cheek and feels a swollen LN on the upper left anterior neck.     Impression and Recommendations:    Acute sinusitis - will tx with Augmentin. Call if not better in one week.  Ok to use Tylenol and  Motrin.  Stay hydrated.  If at any point she feels like she is developing yeast infection and please let us know.  Work note provided to keep her out until Monday to give her time to get on the antibiotics and start feeling better and for her cough to improve.   I discussed the assessment and treatment plan with the patient. The patient was provided an opportunity to ask questions and all were answered. The patient agreed with the plan and demonstrated an understanding of the instructions.   The patient was advised to call back or seek an in-person evaluation if the symptoms worsen or if the condition fails to improve as anticipated.   Beatrice Lecher, MD

## 2019-05-30 ENCOUNTER — Telehealth (INDEPENDENT_AMBULATORY_CARE_PROVIDER_SITE_OTHER): Payer: BC Managed Care – PPO | Admitting: Osteopathic Medicine

## 2019-05-30 ENCOUNTER — Encounter: Payer: Self-pay | Admitting: Osteopathic Medicine

## 2019-05-30 ENCOUNTER — Telehealth: Payer: Self-pay | Admitting: Osteopathic Medicine

## 2019-05-30 ENCOUNTER — Encounter: Payer: Self-pay | Admitting: Family Medicine

## 2019-05-30 DIAGNOSIS — J329 Chronic sinusitis, unspecified: Secondary | ICD-10-CM

## 2019-05-30 DIAGNOSIS — R6889 Other general symptoms and signs: Secondary | ICD-10-CM

## 2019-05-30 DIAGNOSIS — Z20822 Contact with and (suspected) exposure to covid-19: Secondary | ICD-10-CM

## 2019-05-30 MED ORDER — IPRATROPIUM BROMIDE 0.06 % NA SOLN: 2.0000 | Freq: Four times a day (QID) | NASAL | 1 refills | Status: DC

## 2019-05-30 MED ORDER — DOXYCYCLINE HYCLATE 100 MG PO TABS: 100.0000 mg | ORAL_TABLET | Freq: Two times a day (BID) | ORAL | 0 refills | Status: DC

## 2019-05-30 NOTE — Progress Notes (Signed)
Virtual Visit via Video   I connected with      Ruth Parker on 05/30/19 at 4:23 PM  by a telemedicine application and verified that I am speaking with the correct person using two identifiers.  Patient is at home I am in office   I discussed the limitations of evaluation and management by telemedicine and the availability of in person appointments. The patient expressed understanding and agreed to proceed.  History of Present Illness: Ruth Parker is a 43 y.o. female who would like to discuss sinus problem   . Location/Quality: sinuses/facial pressure, mucus drainage  . Context: had to fly recently due to father recently sick. Coworker she had minimal contact with was (+) for COVID. Treatment <1 month ago for sinusitis, symptoms never totally resolved w/ Augmentin . Assoc signs/symptoms: shooting pain in R collarbone at sternoclavicular joint      Observations/Objective: LMP 05/08/2019 (Exact Date)  BP Readings from Last 3 Encounters:  03/01/18 121/77  03/08/17 118/67  01/11/17 122/73   Exam: Normal Speech.    Lab and Radiology Results No results found for this or any previous visit (from the past 72 hour(s)). No results found.     Assessment and Plan: 43 y.o. female with The primary encounter diagnosis was Sinusitis, unspecified chronicity, unspecified location. A diagnosis of Suspected Covid-19 Virus Infection was also pertinent to this visit.  Sounds possibly like recurrent/persistent sinusitis, but worse after traveling concerning for COVID. Will get set up for testing. Collarbone pain sounds like costochondritis, advised NSAID.   PDMP not reviewed this encounter. No orders of the defined types were placed in this encounter.  Meds ordered this encounter  Medications  . doxycycline (VIBRA-TABS) 100 MG tablet    Sig: Take 1 tablet (100 mg total) by mouth 2 (two) times daily.    Dispense:  14 tablet    Refill:  0  . ipratropium (ATROVENT) 0.06 % nasal spray     Sig: Place 2 sprays into both nostrils 4 (four) times daily.    Dispense:  15 mL    Refill:  1   There are no Patient Instructions on file for this visit.  Instructions sent via MyChart. If MyChart not available, pt was given option for info via personal e-mail w/ no guarantee of protected health info over unsecured e-mail communication, and MyChart sign-up instructions were included.   Follow Up Instructions: Return if symptoms worsen or fail to improve, for RECHECK PENDING RESULTS / IF WORSE OR CHANGE.    I discussed the assessment and treatment plan with the patient. The patient was provided an opportunity to ask questions and all were answered. The patient agreed with the plan and demonstrated an understanding of the instructions.   The patient was advised to call back or seek an in-person evaluation if any new concerns, if symptoms worsen or if the condition fails to improve as anticipated.  25 minutes of non-face-to-face time was provided during this encounter.                      Historical information moved to improve visibility of documentation.  Past Medical History:  Diagnosis Date  . Allergy   . Anemia 03/02/10  . History of migraines    Seeing neurologist  . Thyroid disease    abn labs by pcp  . Vaginal Pap smear, abnormal    Past Surgical History:  Procedure Laterality Date  . DILATION AND CURETTAGE OF UTERUS  elective AB   Social History   Tobacco Use  . Smoking status: Never Smoker  . Smokeless tobacco: Never Used  Substance Use Topics  . Alcohol use: Yes    Alcohol/week: 0.0 standard drinks    Comment: Occasionally   family history includes Breast cancer in her maternal aunt and maternal grandmother; Cancer in her maternal aunt and maternal grandmother; Diabetes in her maternal grandfather and paternal grandfather; Glaucoma in her mother; Hypertension in her mother; Stroke in her maternal grandfather, paternal grandfather, and  paternal grandmother.  Medications: Current Outpatient Medications  Medication Sig Dispense Refill  . acetaminophen (TYLENOL) 325 MG tablet Take 650 mg by mouth every 6 (six) hours as needed.      . fexofenadine (ALLEGRA) 180 MG tablet Take 180 mg by mouth daily.      . fluticasone (FLONASE) 50 MCG/ACT nasal spray Place into both nostrils daily.    . naproxen (NAPROSYN) 500 MG tablet TAKE 1 TABLET BY MOUTH THREE TIMES A DAY AS NEEDED 100 tablet 2  . NIKKI 3-0.02 MG tablet TAKE 1 TABLET BY MOUTH EVERY DAY 84 tablet 0  . doxycycline (VIBRA-TABS) 100 MG tablet Take 1 tablet (100 mg total) by mouth 2 (two) times daily. 14 tablet 0  . ipratropium (ATROVENT) 0.06 % nasal spray Place 2 sprays into both nostrils 4 (four) times daily. 15 mL 1  . ondansetron (ZOFRAN ODT) 4 MG disintegrating tablet Take 1 tablet (4 mg total) by mouth every 8 (eight) hours as needed for nausea or vomiting. (Patient not taking: Reported on 05/30/2019) 10 tablet 0   No current facility-administered medications for this visit.    Allergies  Allergen Reactions  . Percocet [Oxycodone-Acetaminophen] Nausea Only    PDMP not reviewed this encounter. No orders of the defined types were placed in this encounter.  Meds ordered this encounter  Medications  . doxycycline (VIBRA-TABS) 100 MG tablet    Sig: Take 1 tablet (100 mg total) by mouth 2 (two) times daily.    Dispense:  14 tablet    Refill:  0  . ipratropium (ATROVENT) 0.06 % nasal spray    Sig: Place 2 sprays into both nostrils 4 (four) times daily.    Dispense:  15 mL    Refill:  1

## 2019-05-30 NOTE — Telephone Encounter (Signed)
COVID TEST NEEDED

## 2019-05-31 ENCOUNTER — Encounter: Payer: Self-pay | Admitting: Family Medicine

## 2019-05-31 ENCOUNTER — Telehealth: Payer: Self-pay | Admitting: General Practice

## 2019-05-31 DIAGNOSIS — Z20822 Contact with and (suspected) exposure to covid-19: Secondary | ICD-10-CM

## 2019-05-31 NOTE — Telephone Encounter (Signed)
Closing this message as pt has been scheduled already.

## 2019-05-31 NOTE — Telephone Encounter (Signed)
Pt has been schedule for covid testing.  Pt was referred by: Emeterio Reeve, DO

## 2019-05-31 NOTE — Addendum Note (Signed)
Addended by: Dimple Nanas on: 05/31/2019 12:27 PM   Modules accepted: Orders

## 2019-05-31 NOTE — Telephone Encounter (Signed)
Patient called to schedule a covid test. Looks like the order came from her e visit?  Call back [phone]

## 2019-06-01 ENCOUNTER — Other Ambulatory Visit: Payer: BC Managed Care – PPO

## 2019-06-01 DIAGNOSIS — Z20822 Contact with and (suspected) exposure to covid-19: Secondary | ICD-10-CM

## 2019-06-05 ENCOUNTER — Encounter: Payer: Self-pay | Admitting: Family Medicine

## 2019-06-06 ENCOUNTER — Encounter: Payer: Self-pay | Admitting: Physician Assistant

## 2019-06-06 ENCOUNTER — Telehealth: Payer: BC Managed Care – PPO | Admitting: Osteopathic Medicine

## 2019-06-06 ENCOUNTER — Encounter: Payer: Self-pay | Admitting: Family Medicine

## 2019-06-06 LAB — NOVEL CORONAVIRUS, NAA: SARS-CoV-2, NAA: DETECTED — AB

## 2019-06-27 NOTE — Addendum Note (Signed)
Addended by: Maryla Morrow on: 06/27/2019 10:28 AM   Modules accepted: Level of Service

## 2019-06-28 ENCOUNTER — Other Ambulatory Visit: Payer: Self-pay | Admitting: Obstetrics & Gynecology

## 2019-09-04 ENCOUNTER — Telehealth: Payer: Self-pay | Admitting: Physician Assistant

## 2019-09-04 NOTE — Telephone Encounter (Signed)
Sent an email to the Agricultural engineer, Elmyra Ricks about her bill for DOS: 05/30/19.  Originally she was billed for office level, [phone] and Dr. Sheppard Coil changed the office level to [phone] due to the My Chart Video visit failed and Dr. Sheppard Coil did a phone visit instead.

## 2019-10-15 ENCOUNTER — Telehealth: Payer: Self-pay

## 2019-10-15 MED ORDER — DROSPIRENONE-ETHINYL ESTRADIOL 3-0.02 MG PO TABS: 1.0000 | ORAL_TABLET | Freq: Every day | ORAL | 0 refills | Status: DC

## 2019-10-15 NOTE — Telephone Encounter (Signed)
Pt needs on refill of OCP until annual appt in Dec. One refill sent

## 2019-10-29 ENCOUNTER — Encounter: Payer: Self-pay | Admitting: Obstetrics & Gynecology

## 2019-10-29 ENCOUNTER — Ambulatory Visit (INDEPENDENT_AMBULATORY_CARE_PROVIDER_SITE_OTHER): Payer: BC Managed Care – PPO | Admitting: Obstetrics & Gynecology

## 2019-10-29 ENCOUNTER — Other Ambulatory Visit: Payer: Self-pay

## 2019-10-29 VITALS — BP 146/78 | HR 71 | Ht 62.0 in | Wt 117.0 lb

## 2019-10-29 DIAGNOSIS — Z23 Encounter for immunization: Secondary | ICD-10-CM

## 2019-10-29 DIAGNOSIS — Z1151 Encounter for screening for human papillomavirus (HPV): Secondary | ICD-10-CM | POA: Diagnosis not present

## 2019-10-29 DIAGNOSIS — Z124 Encounter for screening for malignant neoplasm of cervix: Secondary | ICD-10-CM

## 2019-10-29 DIAGNOSIS — Z01419 Encounter for gynecological examination (general) (routine) without abnormal findings: Secondary | ICD-10-CM

## 2019-10-29 DIAGNOSIS — Z803 Family history of malignant neoplasm of breast: Secondary | ICD-10-CM

## 2019-10-29 DIAGNOSIS — R197 Diarrhea, unspecified: Secondary | ICD-10-CM

## 2019-10-29 DIAGNOSIS — Z0189 Encounter for other specified special examinations: Secondary | ICD-10-CM | POA: Diagnosis not present

## 2019-10-29 MED ORDER — IPRATROPIUM BROMIDE 0.06 % NA SOLN: 2.0000 | Freq: Four times a day (QID) | NASAL | 1 refills | Status: DC

## 2019-10-29 MED ORDER — DROSPIRENONE-ETHINYL ESTRADIOL 3-0.02 MG PO TABS: 1.0000 | ORAL_TABLET | Freq: Every day | ORAL | 6 refills | Status: DC

## 2019-10-29 NOTE — Progress Notes (Signed)
Subjective:    Ruth Parker is a 43 y.o. female who presents for an annual exam. She reports that she wakes up with cramps at night for the last year and has to have a BM. She sometimes has explosive diarrhea. The patient is sexually active. GYN screening history: last pap: was normal. The patient wears seatbelts: yes. The patient participates in regular exercise: yes. Has the patient ever been transfused or tattooed?: no. The patient reports that there is not domestic violence in her life.   Menstrual History: OB History    Gravida  2   Para  1   Term      Preterm      AB  1   Living  1     SAB      TAB  1   Ectopic      Multiple      Live Births              Menarche age: 66 Patient's last menstrual period was 10/23/2019.    The following portions of the patient's history were reviewed and updated as appropriate: allergies, current medications, past family history, past medical history, past social history, past surgical history and problem list.  Review of Systems Pertinent items are noted in HPI.   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 maternal aunts x 2 and maternal GM   Objective:    Ht 5\' 2"  (1.575 m)   Wt 117 lb (53.1 kg)   LMP 10/23/2019   BMI 21.40 kg/m   General Appearance:    Alert, cooperative, no distress, appears stated age  Head:    Normocephalic, without obvious abnormality, atraumatic  Eyes:    PERRL, conjunctiva/corneas clear, EOM's intact, fundi    benign, both eyes  Ears:    Normal TM's and external ear canals, both ears  Nose:   Nares normal, septum midline, mucosa normal, no drainage    or sinus tenderness  Throat:   Lips, mucosa, and tongue normal; teeth and gums normal  Neck:   Supple, symmetrical, trachea midline, no adenopathy;    thyroid:  no enlargement/tenderness/nodules; no carotid   bruit or JVD  Back:     Symmetric, no curvature, ROM normal, no CVA tenderness  Lungs:     Clear  to auscultation bilaterally, respirations unlabored  Chest Wall:    No tenderness or deformity   Heart:    Regular rate and rhythm, S1 and S2 normal, no murmur, rub   or gallop  Breast Exam:    No tenderness, masses, or nipple abnormality  Abdomen:     Soft, non-tender, bowel sounds active all four quadrants,    no masses, no organomegaly  Genitalia:    Normal female without lesion, discharge or tenderness, normal size and shape, anteverted, mobile, non-tender, normal adnexal exam      Extremities:   Extremities normal, atraumatic, no cyanosis or edema  Pulses:   2+ and symmetric all extremities  Skin:   Skin color, texture, turgor normal, no rashes or lesions  Lymph nodes:   Cervical, supraclavicular, and axillary nodes normal  Neurologic:   CNII-XII intact, normal strength, sensation and reflexes    throughout  .    Assessment:    Healthy female exam.   GI disturbances   Plan:     Thin prep Pap smear. with cotesting Refill Yaz Refer to GI Fasting routine labs Rec genetic testing Flu vaccine today

## 2019-10-29 NOTE — Progress Notes (Signed)
Last pap- 12/07/16-negative

## 2019-10-29 NOTE — Telephone Encounter (Signed)
I sent an email on 09/04/2019, received response that Insurance was not covering services due to deductible.  I sent another email stating Insurance paid the first time the patient was seen on 05/08/2019 so that doesn't make sense.    I sent an email on 10/02/2019 inquiring about the previous email because I did not get a response back.  I sent an email on 10/15/2019 inquiring about the previous email because I did not get a response back.  Received response on 10/16/2019 from [place], the Agricultural engineer. She has a rep researching it and she would send me back an update once she had one.   10/29/2019 Ruth Parker came into [facility] today for an appointment and ask to speak with me.  I let her know that I had not heard anything but I would reach back out to [place], the Agricultural engineer.  I sent an email on 10/29/2019 to Elmyra Ricks, the billing manager inquiring about the bill.  I let her know that I also looked in the billing system and I did not see where the changes took place, where office level, [phone] was switched to a [phone] because Dr. Sheppard Coil did not have a video visit because they could not see each other therefore she turned the appointment into a phone visit.  I also inquired about the primary diagnosis.  The primary diagnosis should have been R68.89, suspected COVID, she was positive.

## 2019-10-30 ENCOUNTER — Other Ambulatory Visit: Payer: Self-pay | Admitting: Obstetrics & Gynecology

## 2019-10-30 DIAGNOSIS — R87610 Atypical squamous cells of undetermined significance on cytologic smear of cervix (ASC-US): Secondary | ICD-10-CM | POA: Diagnosis not present

## 2019-10-30 LAB — COMPREHENSIVE METABOLIC PANEL
AG Ratio: 1.5 (calc) (ref 1.0–2.5)
ALT: 9 U/L (ref 6–29)
AST: 17 U/L (ref 10–30)
Albumin: 4.4 g/dL (ref 3.6–5.1)
Alkaline phosphatase (APISO): 38 U/L (ref 31–125)
BUN: 14 mg/dL (ref 7–25)
CO2: 26 mmol/L (ref 20–32)
Calcium: 9 mg/dL (ref 8.6–10.2)
Chloride: 102 mmol/L (ref 98–110)
Creat: 0.56 mg/dL (ref 0.50–1.10)
Globulin: 2.9 g/dL (calc) (ref 1.9–3.7)
Glucose, Bld: 94 mg/dL (ref 65–99)
Potassium: 4 mmol/L (ref 3.5–5.3)
Sodium: 137 mmol/L (ref 135–146)
Total Bilirubin: 0.4 mg/dL (ref 0.2–1.2)
Total Protein: 7.3 g/dL (ref 6.1–8.1)

## 2019-10-30 LAB — CBC
HCT: 36.1 % (ref 35.0–45.0)
Hemoglobin: 12 g/dL (ref 11.7–15.5)
MCH: 33.3 pg — ABNORMAL HIGH (ref 27.0–33.0)
MCHC: 33.2 g/dL (ref 32.0–36.0)
MCV: 100.3 fL — ABNORMAL HIGH (ref 80.0–100.0)
MPV: 8.6 fL (ref 7.5–12.5)
Platelets: 265 10*3/uL (ref 140–400)
RBC: 3.6 10*6/uL — ABNORMAL LOW (ref 3.80–5.10)
RDW: 12.8 % (ref 11.0–15.0)
WBC: 3.7 10*3/uL — ABNORMAL LOW (ref 3.8–10.8)

## 2019-10-30 LAB — VITAMIN D 25 HYDROXY (VIT D DEFICIENCY, FRACTURES): Vit D, 25-Hydroxy: 22 ng/mL — ABNORMAL LOW (ref 30–100)

## 2019-10-30 LAB — HEMOGLOBIN A1C
Hgb A1c MFr Bld: 5 % of total Hgb (ref <5.7)
Mean Plasma Glucose: 97 (calc)
eAG (mmol/L): 5.4 (calc)

## 2019-10-30 LAB — LIPID PANEL
Cholesterol: 180 mg/dL (ref <200)
HDL: 82 mg/dL (ref >=50)
LDL Cholesterol (Calc): 77 mg/dL (calc)
Non-HDL Cholesterol (Calc): 98 mg/dL (calc) (ref <130)
Total CHOL/HDL Ratio: 2.2 (calc) (ref <5.0)
Triglycerides: 124 mg/dL (ref <150)

## 2019-10-30 LAB — TSH: TSH: 3.41 mIU/L

## 2019-11-02 LAB — CYTOLOGY - PAP
Comment: NEGATIVE
Diagnosis: UNDETERMINED — AB
High risk HPV: NEGATIVE

## 2019-11-05 ENCOUNTER — Other Ambulatory Visit: Payer: Self-pay | Admitting: Obstetrics & Gynecology

## 2019-11-05 NOTE — Progress Notes (Signed)
Vit D prescribed

## 2019-11-06 ENCOUNTER — Encounter: Payer: Self-pay | Admitting: *Deleted

## 2019-11-13 ENCOUNTER — Encounter: Payer: Self-pay | Admitting: *Deleted

## 2019-11-13 NOTE — Progress Notes (Signed)
Copy of [facility] results scanned and routed to Dr Hulan Fray.  Copy of labs mailed to patients address.

## 2019-11-22 ENCOUNTER — Other Ambulatory Visit: Payer: Self-pay | Admitting: Obstetrics & Gynecology

## 2020-01-03 ENCOUNTER — Encounter: Payer: Self-pay | Admitting: Obstetrics & Gynecology

## 2020-11-27 ENCOUNTER — Encounter: Payer: Self-pay | Admitting: Nurse Practitioner

## 2020-11-27 ENCOUNTER — Telehealth (INDEPENDENT_AMBULATORY_CARE_PROVIDER_SITE_OTHER): Payer: BC Managed Care – PPO | Admitting: Nurse Practitioner

## 2020-11-27 DIAGNOSIS — J329 Chronic sinusitis, unspecified: Secondary | ICD-10-CM

## 2020-11-27 DIAGNOSIS — J40 Bronchitis, not specified as acute or chronic: Secondary | ICD-10-CM | POA: Diagnosis not present

## 2020-11-27 DIAGNOSIS — J069 Acute upper respiratory infection, unspecified: Secondary | ICD-10-CM

## 2020-11-27 LAB — POCT INFLUENZA A/B
Influenza A, POC: NEGATIVE
Influenza B, POC: NEGATIVE

## 2020-11-27 MED ORDER — FLUCONAZOLE 150 MG PO TABS: 150.0000 mg | ORAL_TABLET | Freq: Every day | ORAL | 1 refills | Status: DC

## 2020-11-27 MED ORDER — PSEUDOEPHEDRINE-GUAIFENESIN ER 120-1200 MG PO TB12: 1.0000 | ORAL_TABLET | Freq: Two times a day (BID) | ORAL | 0 refills | Status: DC

## 2020-11-27 MED ORDER — BENZONATATE 200 MG PO CAPS: 200.0000 mg | ORAL_CAPSULE | Freq: Three times a day (TID) | ORAL | 0 refills | Status: DC | PRN

## 2020-11-27 MED ORDER — AMOXICILLIN-POT CLAVULANATE 875-125 MG PO TABS: 1.0000 | ORAL_TABLET | Freq: Two times a day (BID) | ORAL | 0 refills | Status: DC

## 2020-11-27 NOTE — Patient Instructions (Signed)
Over the counter medications that may be helpful for symptoms: . Guaifenesin 1200 mg extended release tabs twice daily, with plenty of water o For cough and congestion o Brand name: Mucinex   . Pseudoephedrine 30 mg, one or two tabs every 4 to 6 hours o For sinus congestion o Brand name: Sudafed o You must get this from the pharmacy counter.  . Oxymetazoline nasal spray each morning, one spray in each nostril, for NO MORE THAN 3 days  o For nasal and sinus congestion o Brand name: Afrin . Saline nasal spray or Saline Nasal Irrigation 3-5 times a day o For nasal and sinus congestion o Brand names: Ocean or AYR . Fluticasone nasal spray, one spray in each nostril, each morning after oxymetazoline and saline, if used o For nasal and sinus congestion o Brand name: Flonase . Warm salt water gargles  o For sore throat o Every few hours as needed . Alternate ibuprofen 400-600 mg and acetaminophen 1000 mg every 4-6 hours o For fever, body aches, headache o Brand names: Motrin or Advil and Tylenol . Dextromethorphan 12-hour cough version 30 mg every 12 hours  o For cough o Brand name: Delsym Stop all other cold medications for now (Nyquil, Dayquil, Tylenol Cold, Theraflu, etc) and other non-prescription cough/cold preparations. Many of these have the same ingredients listed above and could cause an overdose of medication.   General Instructions . Allow your body to rest . Drink PLENTY of fluids . Isolate yourself from everyone, even family, until test results have returned  If your COVID-19 test is positive . Then you ARE INFECTED and you can pass the virus to others . You must quarantine from others for a minimum of  o 10 days since symptoms started AND o You are fever free for 24 hours WITHOUT any medication to reduce fever AND o Your symptoms are improving . Do not go to the store or other public areas . Do not go around household members who are not known to be infected with  COVID-19 . If you MUST leave you area of quarantine (example: go to a bathroom you share with others in your home), you must o Wear a mask o Wash your hands thoroughly o Wipe down any surfaces you touch . Do not share food, drinks, towels, or other items with other persons . Dispose of your own tissues, food containers, etc  Once you have recovered, please continue good preventive care measures, including:  . wearing a mask when in public . wash your hands frequently . avoid touching your face/nose/eyes . cover coughs/sneezes with the inside of your elbow . stay out of crowds . keep a 6 foot distance from others  Go to the nearest hospital emergency room if fever/cough/breathlessness are severe or illness seems like a threat to life.   

## 2020-11-27 NOTE — Progress Notes (Signed)
Virtual Video Visit via MyChart Note  I connected with  Ruth Parker on 11/27/20 at  1:10 PM EST by the video enabled telemedicine application for , MyChart, and verified that I am speaking with the correct person using two identifiers.   I introduced myself as a Publishing rights manager with the practice. We discussed the limitations of evaluation and management by telemedicine and the availability of in person appointments. The patient expressed understanding and agreed to proceed.  Participating parties in this visit include: The patient and the nurse practitioner listed.  The patient is: At home I am: In the office  Subjective:    CC:  Chief Complaint  Patient presents with  . Sore Throat    HPI: Ruth Parker is a 45 y.o. year old female presenting today via MyChart today for consistently sore throat with left sided facial and ear pain and pressure that has been going on for about 4 days. 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 She has had COVID in the past, at the start of the pandemic, but has not been vaccinated.   She is also experiencing chills, body aches, moderate intermittent cough. She denies known fevers, shortness of breath, n/v/d, for LoT/LoS.   Past medical history, Surgical history, Family history not pertinant except as noted below, Social history, Allergies, and medications have been entered into the medical record, reviewed, and corrections made.   Review of Systems:  All review of systems negative except what is listed in the HPI   Objective:    General:  Speaking clearly in complete sentences. Absent shortness of breath noted.   Alert and oriented x3.   Normal judgment.  Absent acute distress.   Impression and Recommendations:    1. Sinobronchitis - amoxicillin-clavulanate (AUGMENTIN) 875-125 MG tablet; Take 1 tablet by mouth 2 (two) times daily.  Dispense: 10 tablet; Refill: 0 - benzonatate (TESSALON) 200 MG  capsule; Take 1 capsule (200 mg total) by mouth 3 (three) times daily as needed for cough.  Dispense: 30 capsule; Refill: 0 - Pseudoephedrine-Guaifenesin (MUCINEX D MAX STRENGTH) [phone] MG TB12; Take 1 tablet by mouth 2 (two) times daily.  Dispense: 20 tablet; Refill: 0 - fluconazole (DIFLUCAN) 150 MG tablet; Take 1 tablet (150 mg total) by mouth daily.  Dispense: 1 tablet; Refill: 1  2. Upper respiratory tract infection, unspecified type - benzonatate (TESSALON) 200 MG capsule; Take 1 capsule (200 mg total) by mouth 3 (three) times daily as needed for cough.  Dispense: 30 capsule; Refill: 0 - Novel Coronavirus, NAA ([facility]) - POCT rapid strep A - Pseudoephedrine-Guaifenesin (MUCINEX D MAX STRENGTH) [phone] MG TB12; Take 1 tablet by mouth 2 (two) times daily.  Dispense: 20 tablet; Refill: 0 - fluconazole (DIFLUCAN) 150 MG tablet; Take 1 tablet (150 mg total) by mouth daily.  Dispense: 1 tablet; Refill: 1  Will test today for COVID, flu, and strep. Given the significant sinus and ear pain/pressure I will also start antibiotic treatment for suspected sinusitis.  Recommendations for OTC medications discussed with patient and provided on AVS.  Quarantine recommendations provided.  Diflucan provided for possible yeast infection following antibiotic therapy.  Work note provided detailing the testing being performed and predicted time required off based on results.  Follow-up if symptoms worsen or fail to improve.  Follow-up if symptoms worsen or fail to improve.    I discussed the assessment and treatment plan with the patient. The patient was provided an opportunity  to ask questions and all were answered. The patient agreed with the plan and demonstrated an understanding of the instructions.   The patient was advised to call back or seek an in-person evaluation if the symptoms worsen or if the condition fails to improve as anticipated.  I provided 20 minutes of non-face-to-face interaction with  this MYCHART visit including intake, same-day documentation, and chart review.   Tollie Eth, NP

## 2020-12-03 ENCOUNTER — Telehealth: Payer: Self-pay

## 2020-12-03 LAB — NOVEL CORONAVIRUS, NAA: SARS-CoV-2, NAA: DETECTED — AB

## 2020-12-03 NOTE — Progress Notes (Signed)
Please call patient: COVID test positive.  Please continue to quarantine for at least 10 days from symptom onset and until you have been fever free for >24 hours without the use of medication.   Monitor for signs of severe or worsening infection such as fevers over 103 that do not respond to fever reducing medication, severe shortness of breath, dizziness, or chest pain. Report to the emergency department if these develop.   You can expect to feel tired and carry a cough for several weeks after the infection. If your symptoms worsen or new symptoms develop that are not controlled with recommended medications, please let us know.

## 2020-12-03 NOTE — Telephone Encounter (Signed)
Patient tested positive for COVID-19 recently with symptom onset 11/23/2020. She reports that her symptoms are improving and she is not running a fever. Will send work note through [employer] allowing her to return to work tomorrow 12/04/2020.

## 2020-12-03 NOTE — Telephone Encounter (Signed)
Ruth Parker called for a work note. She was not vaccinated. Symptoms started on 11/23/2020. She never had a fever, symptoms are improving and she never had a fever. She is requesting a work note to return to work.

## 2021-01-13 ENCOUNTER — Telehealth: Payer: Self-pay | Admitting: *Deleted

## 2021-01-13 NOTE — Progress Notes (Signed)
One refill of OCP sent to pharmacy. Pt is aware she will not get any more refills until she comes for annual appt.

## 2021-01-13 NOTE — Telephone Encounter (Signed)
Left patient an urgent message to call and schedule annual as soon as possible. Birth control will only be refilled one time prior to completed appointment.

## 2021-04-06 ENCOUNTER — Telehealth: Payer: Self-pay | Admitting: *Deleted

## 2021-04-06 NOTE — Telephone Encounter (Signed)
Returned call from 8:21 AM. Left patient a message to call or MyChart message appointment availability for next week. Patient given options of days that office has availability.

## 2021-04-14 ENCOUNTER — Ambulatory Visit (INDEPENDENT_AMBULATORY_CARE_PROVIDER_SITE_OTHER): Payer: BC Managed Care – PPO | Admitting: Obstetrics and Gynecology

## 2021-04-14 ENCOUNTER — Other Ambulatory Visit (HOSPITAL_COMMUNITY)
Admission: RE | Admit: 2021-04-14 | Discharge: 2021-04-14 | Disposition: A | Discharge status: Home / Self Care | Payer: BC Managed Care – PPO | Source: Ambulatory Visit | Attending: Obstetrics and Gynecology | Admitting: Obstetrics and Gynecology

## 2021-04-14 ENCOUNTER — Encounter: Payer: Self-pay | Admitting: Obstetrics and Gynecology

## 2021-04-14 ENCOUNTER — Other Ambulatory Visit: Payer: Self-pay

## 2021-04-14 VITALS — BP 146/87 | HR 78 | Resp 16 | Ht 62.0 in | Wt 125.0 lb

## 2021-04-14 DIAGNOSIS — E559 Vitamin D deficiency, unspecified: Secondary | ICD-10-CM | POA: Diagnosis not present

## 2021-04-14 DIAGNOSIS — Z01419 Encounter for gynecological examination (general) (routine) without abnormal findings: Secondary | ICD-10-CM

## 2021-04-14 DIAGNOSIS — N946 Dysmenorrhea, unspecified: Secondary | ICD-10-CM

## 2021-04-14 DIAGNOSIS — Z13220 Encounter for screening for lipoid disorders: Secondary | ICD-10-CM | POA: Diagnosis not present

## 2021-04-14 MED ORDER — NAPROXEN 500 MG PO TABS: 500.0000 mg | ORAL_TABLET | Freq: Three times a day (TID) | ORAL | 0 refills | Status: DC | PRN

## 2021-04-14 MED ORDER — DROSPIRENONE-ETHINYL ESTRADIOL 3-0.02 MG PO TABS: 1.0000 | ORAL_TABLET | Freq: Every day | ORAL | 6 refills | Status: DC

## 2021-04-14 NOTE — Progress Notes (Signed)
GYNECOLOGY ANNUAL PREVENTATIVE CARE ENCOUNTER NOTE  History:     Ruth Parker is a 45 y.o. G58P0011 female here for a routine annual gynecologic exam.  Current complaints: None.    Denies abnormal vaginal bleeding, discharge, pelvic pain, problems with intercourse or other gynecologic concerns.    Gynecologic History Patient's last menstrual period was 04/07/2021. Contraception: OCP (estrogen/progesterone) Last Pap: 2020. Results were: abnormal ASC-US, with negative HPV.  Last mammogram: Never, Mammo ordered today.   Obstetric History OB History  Gravida Para Term Preterm AB Living  2 1     1 1   SAB IAB Ectopic Multiple Live Births    1          # Outcome Date GA Lbr Len/2nd Weight Sex Delivery Anes PTL Lv  2 IAB           1 Para             Past Medical History:  Diagnosis Date  . Allergy   . Anemia 03/02/10  . History of migraines    Seeing neurologist  . Thyroid disease    abn labs by pcp  . Vaginal Pap smear, abnormal     Past Surgical History:  Procedure Laterality Date  . DILATION AND CURETTAGE OF UTERUS     elective AB    Current Outpatient Medications on File Prior to Visit  Medication Sig Dispense Refill  . acetaminophen (TYLENOL) 325 MG tablet Take 650 mg by mouth every 6 (six) hours as needed.    . drospirenone-ethinyl estradiol (NIKKI) 3-0.02 MG tablet Take 1 tablet by mouth daily. 96 tablet 6  . fexofenadine (ALLEGRA) 180 MG tablet Take 180 mg by mouth daily.    . naproxen (NAPROSYN) 500 MG tablet TAKE 1 TABLET BY MOUTH THREE TIMES A DAY AS NEEDED 100 tablet 2   No current facility-administered medications on file prior to visit.    Allergies  Allergen Reactions  . Percocet [Oxycodone-Acetaminophen] Nausea Only    Social History:  reports that she has never smoked. She has never used smokeless tobacco. She reports current alcohol use. She reports that she does not use drugs.  Family History  Problem Relation Age of Onset  . Breast cancer  Maternal Grandmother   . Cancer Maternal Grandmother        breast  . Breast cancer Maternal Aunt   . Cancer Maternal Aunt        breast  . Glaucoma Mother   . Hypertension Mother   . Diabetes Maternal Grandfather   . Stroke Maternal Grandfather   . Stroke Paternal Grandmother   . Diabetes Paternal Grandfather   . Stroke Paternal Grandfather     The following portions of the patient's history were reviewed and updated as appropriate: allergies, current medications, past family history, past medical history, past social history, past surgical history and problem list.  Review of Systems Pertinent items noted in HPI and remainder of comprehensive ROS otherwise negative.  Physical Exam:  BP (!) 146/87   Pulse 78   Resp 16   Ht 5\' 2"  (1.575 m)   Wt 125 lb (56.7 kg)   LMP 04/07/2021   BMI 22.86 kg/m  CONSTITUTIONAL: Well-developed, well-nourished female in no acute distress.  HENT:  Normocephalic, atraumatic, External right and left ear normal.  EYES: Conjunctivae and EOM are normal. Pupils are equal, round, and reactive to light. No scleral icterus.  NECK: Normal range of motion, supple, no masses.  Normal thyroid.  SKIN: Skin is warm and dry. No rash noted. Not diaphoretic. No erythema. No pallor. MUSCULOSKELETAL: Normal range of motion. No tenderness.  No cyanosis, clubbing, or edema. NEUROLOGIC: Alert and oriented to person, place, and time. Normal reflexes, muscle tone coordination.  PSYCHIATRIC: Normal mood and affect. Normal behavior. Normal judgment and thought content. CARDIOVASCULAR: Normal heart rate noted, regular rhythm RESPIRATORY: Clear to auscultation bilaterally. Effort and breath sounds normal, no problems with respiration noted. BREASTS: Symmetric in size. No masses, tenderness, skin changes, nipple drainage, or lymphadenopathy bilaterally. Performed in the presence of a chaperone. ABDOMEN: Soft, no distention noted.  No tenderness, rebound or guarding.  PELVIC:  Normal appearing external genitalia and urethral meatus; normal appearing vaginal mucosa and cervix.  No abnormal discharge noted.  Pap smear obtained.  Normal uterine size, no other palpable masses, no uterine or adnexal tenderness.  Performed in the presence of a chaperone.   Assessment and Plan:   1. Well woman exam with routine gynecological exam  - Cytology - PAP( [place]) - Vitamin D (25 hydroxy) - Lipid Profile - MM Digital Screening; Future  2. Menstrual cramps  - naproxen (NAPROSYN) 500 MG tablet; Take 1 tablet (500 mg total) by mouth 3 (three) times daily as needed.  Dispense: 100 tablet; Refill: 0  Will follow up results of pap smear and manage accordingly. Mammogram scheduled Routine preventative health maintenance measures emphasized. Please refer to After Visit Summary for other counseling recommendations.      Ruth Parker, Ruth Rutherford, NP [facility] for [employer], [facility]

## 2021-04-15 LAB — LIPID PANEL
Cholesterol: 183 mg/dL (ref <200)
HDL: 84 mg/dL (ref >=50)
LDL Cholesterol (Calc): 82 mg/dL (calc)
Non-HDL Cholesterol (Calc): 99 mg/dL (calc) (ref <130)
Total CHOL/HDL Ratio: 2.2 (calc) (ref <5.0)
Triglycerides: 87 mg/dL (ref <150)

## 2021-04-15 LAB — VITAMIN D 25 HYDROXY (VIT D DEFICIENCY, FRACTURES): Vit D, 25-Hydroxy: 35 ng/mL (ref 30–100)

## 2021-04-16 LAB — CYTOLOGY - PAP
Comment: NEGATIVE
Diagnosis: NEGATIVE
Diagnosis: REACTIVE
High risk HPV: NEGATIVE

## 2021-04-30 ENCOUNTER — Other Ambulatory Visit: Payer: Self-pay

## 2021-09-01 DIAGNOSIS — R109 Unspecified abdominal pain: Secondary | ICD-10-CM | POA: Diagnosis not present

## 2021-09-01 DIAGNOSIS — N3000 Acute cystitis without hematuria: Secondary | ICD-10-CM | POA: Diagnosis not present

## 2021-09-01 DIAGNOSIS — N3001 Acute cystitis with hematuria: Secondary | ICD-10-CM | POA: Diagnosis not present

## 2021-09-01 DIAGNOSIS — R1032 Left lower quadrant pain: Secondary | ICD-10-CM | POA: Diagnosis not present

## 2021-09-02 ENCOUNTER — Ambulatory Visit: Payer: BC Managed Care – PPO | Admitting: Medical-Surgical

## 2021-09-02 ENCOUNTER — Encounter: Payer: Self-pay | Admitting: Medical-Surgical

## 2021-09-02 VITALS — BP 160/73 | HR 98 | Temp 97.9°F | Resp 20 | Ht 62.0 in | Wt 120.0 lb

## 2021-09-02 DIAGNOSIS — N390 Urinary tract infection, site not specified: Secondary | ICD-10-CM | POA: Diagnosis not present

## 2021-09-02 DIAGNOSIS — R109 Unspecified abdominal pain: Secondary | ICD-10-CM

## 2021-09-02 DIAGNOSIS — R39198 Other difficulties with micturition: Secondary | ICD-10-CM

## 2021-09-02 LAB — POCT URINALYSIS DIP (CLINITEK)
Bilirubin, UA: NEGATIVE
Blood, UA: NEGATIVE
Glucose, UA: NEGATIVE mg/dL
Ketones, POC UA: NEGATIVE mg/dL
Leukocytes, UA: NEGATIVE
Nitrite, UA: NEGATIVE
POC PROTEIN,UA: NEGATIVE
Spec Grav, UA: 1.02 (ref 1.010–1.025)
Urobilinogen, UA: 0.2 E.U./dL
pH, UA: 7 (ref 5.0–8.0)

## 2021-09-02 MED ORDER — CULTURELLE DIGESTIVE HEALTH PO CAPS: 1.0000 | ORAL_CAPSULE | Freq: Every day | ORAL | 0 refills | Status: DC

## 2021-09-02 MED ORDER — KETOROLAC TROMETHAMINE 60 MG/2ML IM SOLN
60.0000 mg | Freq: Once | INTRAMUSCULAR | Status: AC
  Administered 2021-09-02: 60 mg via INTRAMUSCULAR

## 2021-09-02 MED ORDER — CIPROFLOXACIN HCL 250 MG PO TABS: 250.0000 mg | ORAL_TABLET | Freq: Two times a day (BID) | ORAL | 0 refills | Status: DC

## 2021-09-02 MED ORDER — CEFTRIAXONE SODIUM 1 G IJ SOLR
1.0000 g | Freq: Once | INTRAMUSCULAR | Status: AC
Start: 2021-09-02 — End: 2021-09-02
  Administered 2021-09-02: 1 g via INTRAMUSCULAR

## 2021-09-02 NOTE — Progress Notes (Addendum)
  HPI with pertinent ROS:   CC: Dysuria/left flank pain  HPI: Is a 45 year old female presenting today with complaints of 3 days of dysuria and left flank pain.  She was seen at the ED yesterday evening and diagnosed with a UTI.  They did do a CT with no evidence of renal stones or other causes for her left flank pain.  She was given IV Rocephin x1 dose and sent home with a prescription for Keflex.  Today she reports that her pain is no better and that she rates it at as a 7 out of 10.  Having regular bowel movements  I reviewed the past medical history, family history, social history, surgical history, and allergies today and no changes were needed.  Please see the problem list section below in epic for further details.   Physical exam:   General: Well Developed, well nourished, restless, pacing.  Neuro: Alert and oriented x3.  HEENT: Normocephalic, atraumatic.  Skin: Warm and dry. Cardiac: Regular rate and rhythm, no murmurs rubs or gallops, no lower extremity edema.  Respiratory: Clear to auscultation bilaterally. Not using accessory muscles, speaking in full sentences. Abdomen: Soft, left upper and lower quadrant tenderness, nondistended. Bowel sounds + x 4 quadrants. No HSM appreciated.   Impression and Recommendations:    1. Difficulty in urination POCT UA repeated today with negative results. - POCT URINALYSIS DIP (CLINITEK)  2. Left flank pain 3. Complicated UTI (urinary tract infection) Hospital records were reviewed and discussed with patient.  No stones or abnormality found on CT to explain your current symptoms however they did not use IV contrast.  Reassuring that her urine looks good today but suspect that she has a bit of pyelonephritis.  Repeating Rocephin 1 g today and switching from Keflex to Cipro to 50 mg twice daily for 7 days.  Toradol 60 mg IM given in office.  After approximately 15 minutes, patient reported that she was starting to feel some relief of her  symptoms.  Low threshold for repeat imaging with IV contrast so advised patient to notify us should her symptoms not be resolving or at least improving by Friday.  Patient verbalized understanding and is agreeable to the plan. - ketorolac (TORADOL) injection 60 mg - cefTRIAXone (ROCEPHIN) injection 1 g  Return if symptoms worsen or fail to improve. ___________________________________________ Thayer Ohm, DNP, APRN, FNP-BC Primary Care and Sports Medicine [facility] [place]

## 2021-09-03 ENCOUNTER — Encounter: Payer: Self-pay | Admitting: Medical-Surgical

## 2021-09-07 ENCOUNTER — Telehealth: Payer: Self-pay

## 2021-09-07 MED ORDER — CIPROFLOXACIN HCL 250 MG PO TABS
250.0000 mg | ORAL_TABLET | Freq: Two times a day (BID) | ORAL | 0 refills | Status: DC
Start: 2021-09-07 — End: 2022-04-21

## 2021-09-07 NOTE — Telephone Encounter (Signed)
Medication sent. Left message advising patient of recommendations.

## 2021-09-07 NOTE — Telephone Encounter (Signed)
Nizhoni called and left a message stating the Cipro was only written for #7 tablets. Did you want her to take the Cipro for 7 days?

## 2021-09-08 ENCOUNTER — Inpatient Hospital Stay: Payer: BC Managed Care – PPO | Admitting: Physician Assistant

## 2021-10-22 ENCOUNTER — Other Ambulatory Visit: Payer: Self-pay | Admitting: *Deleted

## 2021-10-22 DIAGNOSIS — N946 Dysmenorrhea, unspecified: Secondary | ICD-10-CM

## 2021-10-22 MED ORDER — NAPROXEN 500 MG PO TABS: 500.0000 mg | ORAL_TABLET | Freq: Three times a day (TID) | ORAL | 0 refills | Status: DC | PRN

## 2022-04-21 ENCOUNTER — Encounter: Payer: Self-pay | Admitting: Sports Medicine

## 2022-04-21 ENCOUNTER — Ambulatory Visit: Payer: BC Managed Care – PPO | Admitting: Sports Medicine

## 2022-04-21 DIAGNOSIS — J0100 Acute maxillary sinusitis, unspecified: Secondary | ICD-10-CM | POA: Diagnosis not present

## 2022-04-21 MED ORDER — FLUCONAZOLE 150 MG PO TABS: 150.0000 mg | ORAL_TABLET | Freq: Once | ORAL | 0 refills | Status: DC

## 2022-04-21 MED ORDER — PREDNISONE 50 MG PO TABS: ORAL_TABLET | ORAL | 0 refills | Status: DC

## 2022-04-21 MED ORDER — IPRATROPIUM BROMIDE 0.06 % NA SOLN: 2.0000 | Freq: Four times a day (QID) | NASAL | 11 refills | Status: DC

## 2022-04-21 MED ORDER — AMOXICILLIN-POT CLAVULANATE 875-125 MG PO TABS: 1.0000 | ORAL_TABLET | Freq: Two times a day (BID) | ORAL | 0 refills | Status: DC

## 2022-04-21 MED ORDER — FLUCONAZOLE 150 MG PO TABS: 150.0000 mg | ORAL_TABLET | Freq: Once | ORAL | 0 refills | Status: AC

## 2022-04-21 NOTE — Assessment & Plan Note (Signed)
Pleasant 46 year old female, she has had increasing facial pain and pressure with nasal discharge, left-sided cheek pain, radiation to the teeth in the ears, no cough. No fevers, chills per On exam she has tenderness over the left maxillary sinus, boggy and erythematous turbinates left nasal passageway. Cervical lymphadenopathy. Lungs are clear, heart sounds good. Adding prednisone, nasal Atrovent, Augmentin. Blood pressure was significantly elevated today, she did take some Sudafed. Return to see [place] if not better after the course of antibiotics.

## 2022-04-21 NOTE — Progress Notes (Signed)
    Procedures performed today:    None.  Independent interpretation of notes and tests performed by another provider:   None.  Brief History, Exam, Impression, and Recommendations:    Acute maxillary sinusitis Pleasant 46 year old female, she has had increasing facial pain and pressure with nasal discharge, left-sided cheek pain, radiation to the teeth in the ears, no cough. No fevers, chills per On exam she has tenderness over the left maxillary sinus, boggy and erythematous turbinates left nasal passageway. Cervical lymphadenopathy. Lungs are clear, heart sounds good. Adding prednisone, nasal Atrovent, Augmentin. Blood pressure was significantly elevated today, she did take some Sudafed. Return to see [place] if not better after the course of antibiotics.    ___________________________________________ Ihor Austin. Benjamin Stain, M.D., ABFM., CAQSM. Primary Care and Sports Medicine [place] [facility]  Adjunct Instructor of Family Medicine  [employer]

## 2022-06-30 ENCOUNTER — Other Ambulatory Visit: Payer: Self-pay | Admitting: *Deleted

## 2022-06-30 MED ORDER — DROSPIRENONE-ETHINYL ESTRADIOL 3-0.02 MG PO TABS: 1.0000 | ORAL_TABLET | Freq: Every day | ORAL | 0 refills | Status: DC

## 2022-06-30 NOTE — Telephone Encounter (Cosign Needed)
Pt called requesting a RF on her OCP.  Appt made in Sept for her annual and 1 RF sent to [facility] in [place] for her OCP's

## 2022-08-02 ENCOUNTER — Ambulatory Visit: Payer: BC Managed Care – PPO | Admitting: Obstetrics & Gynecology

## 2022-08-16 ENCOUNTER — Encounter: Payer: Self-pay | Admitting: Obstetrics and Gynecology

## 2022-08-16 ENCOUNTER — Ambulatory Visit (INDEPENDENT_AMBULATORY_CARE_PROVIDER_SITE_OTHER): Payer: BC Managed Care – PPO | Admitting: Obstetrics and Gynecology

## 2022-08-16 VITALS — BP 144/82 | HR 73 | Resp 16 | Ht 62.0 in | Wt 123.0 lb

## 2022-08-16 DIAGNOSIS — Z01411 Encounter for gynecological examination (general) (routine) with abnormal findings: Secondary | ICD-10-CM | POA: Diagnosis not present

## 2022-08-16 DIAGNOSIS — Z1231 Encounter for screening mammogram for malignant neoplasm of breast: Secondary | ICD-10-CM

## 2022-08-16 DIAGNOSIS — Z3009 Encounter for other general counseling and advice on contraception: Secondary | ICD-10-CM | POA: Diagnosis not present

## 2022-08-16 DIAGNOSIS — Z23 Encounter for immunization: Secondary | ICD-10-CM

## 2022-08-16 DIAGNOSIS — Z01419 Encounter for gynecological examination (general) (routine) without abnormal findings: Secondary | ICD-10-CM | POA: Diagnosis not present

## 2022-08-16 DIAGNOSIS — N946 Dysmenorrhea, unspecified: Secondary | ICD-10-CM

## 2022-08-16 DIAGNOSIS — Z1211 Encounter for screening for malignant neoplasm of colon: Secondary | ICD-10-CM

## 2022-08-16 MED ORDER — NAPROXEN 500 MG PO TABS: 500.0000 mg | ORAL_TABLET | Freq: Three times a day (TID) | ORAL | 0 refills | Status: DC | PRN

## 2022-08-16 MED ORDER — DROSPIRENONE-ETHINYL ESTRADIOL 3-0.02 MG PO TABS: 1.0000 | ORAL_TABLET | Freq: Every day | ORAL | 0 refills | Status: DC

## 2022-08-16 NOTE — Progress Notes (Signed)
GYNECOLOGY ANNUAL PREVENTATIVE CARE ENCOUNTER NOTE  Subjective:   Ruth Parker is a 46 y.o. G89P0011 female here for a annual gynecologic exam. Current complaints: none, needs OCP refill. She is happy with OCPs, has monthly periods ,normal for her. Occasionally they are heavier.    Denies abnormal vaginal bleeding, discharge, pelvic pain, problems with intercourse or other gynecologic concerns. Declines STI screen.   Gynecologic History Patient's last menstrual period was 07/26/2022. Contraception: OCP (estrogen/progesterone) Last Pap: 2022. Results: normal Last mammogram: has never had DEXA: has never had  Obstetric History OB History  Gravida Para Term Preterm AB Living  2 1     1 1   SAB IAB Ectopic Multiple Live Births    1          # Outcome Date GA Lbr Len/2nd Weight Sex Delivery Anes PTL Lv  2 IAB           1 Para             Past Medical History:  Diagnosis Date   Allergy    Anemia 03/02/10   History of migraines    Seeing neurologist   Thyroid disease    abn labs by pcp   Vaginal Pap smear, abnormal     Past Surgical History:  Procedure Laterality Date   DILATION AND CURETTAGE OF UTERUS     elective AB    Current Outpatient Medications on File Prior to Visit  Medication Sig Dispense Refill   acetaminophen (TYLENOL) 325 MG tablet Take 650 mg by mouth every 6 (six) hours as needed.     fexofenadine (ALLEGRA) 180 MG tablet Take 180 mg by mouth daily.     No current facility-administered medications on file prior to visit.    Allergies  Allergen Reactions   Percocet [Oxycodone-Acetaminophen] Nausea Only    Social History   Socioeconomic History   Marital status: Married    Spouse name: Not on file   Number of children: Not on file   Years of education: Not on file   Highest education level: Not on file  Occupational History   Not on file  Tobacco Use   Smoking status: Never   Smokeless tobacco: Never  Vaping Use   Vaping Use: Never used   Substance and Sexual Activity   Alcohol use: Yes    Alcohol/week: 0.0 standard drinks of alcohol    Comment: Occasionally   Drug use: No   Sexual activity: Yes    Partners: Male    Birth control/protection: Pill  Other Topics Concern   Not on file  Social History Narrative   Not on file   Social Determinants of Health   Financial Resource Strain: Not on file  Food Insecurity: Not on file  Transportation Needs: Not on file  Physical Activity: Not on file  Stress: Not on file  Social Connections: Not on file  Intimate Partner Violence: Not on file    Family History  Problem Relation Age of Onset   Breast cancer Maternal Grandmother    Cancer Maternal Grandmother        breast   Breast cancer Maternal Aunt    Cancer Maternal Aunt        breast   Glaucoma Mother    Hypertension Mother    Diabetes Maternal Grandfather    Stroke Maternal Grandfather    Stroke Paternal Grandmother    Diabetes Paternal Grandfather    Stroke Paternal Grandfather    The following portions  of the patient's history were reviewed and updated as appropriate: allergies, current medications, past family history, past medical history, past social history, past surgical history and problem list.  Review of Systems Pertinent items are noted in HPI.   Objective:  BP (!) 144/82   Pulse 73   Resp 16   Ht 5\' 2"  (1.575 m)   Wt 123 lb (55.8 kg)   LMP 07/26/2022   BMI 22.50 kg/m  CONSTITUTIONAL: Well-developed, well-nourished female in no acute distress.  HENT:  Normocephalic, atraumatic, External right and left ear normal. Oropharynx is clear and moist EYES: Conjunctivae and EOM are normal. Pupils are equal, round, and reactive to light. No scleral icterus.  NECK: Normal range of motion, supple, no masses.  Normal thyroid.  SKIN: Skin is warm and dry. No rash noted. Not diaphoretic. No erythema. No pallor. NEUROLOGIC: Alert and oriented to person, place, and time. Normal reflexes, muscle tone  coordination. No cranial nerve deficit noted. PSYCHIATRIC: Normal mood and affect. Normal behavior. Normal judgment and thought content. CARDIOVASCULAR: Normal heart rate noted RESPIRATORY: Effort normal, no problems with respiration noted. BREASTS: Symmetric in size. No masses, skin changes, nipple drainage, or lymphadenopathy. ABDOMEN: Soft, no distention noted.  No tenderness, rebound or guarding.  PELVIC: Normal appearing external genitalia; normal appearing vaginal mucosa and cervix.  No abnormal discharge noted.  Normal uterine size, no other palpable masses, no uterine or adnexal tenderness. MUSCULOSKELETAL: Normal range of motion. No tenderness.  No cyanosis, clubbing, or edema.   Exam done with chaperone present.  Assessment and Plan:   1. Menstrual cramps - naproxen (NAPROSYN) 500 MG tablet; Take 1 tablet (500 mg total) by mouth 3 (three) times daily as needed.  Dispense: 100 tablet; Refill: 0  2. Well woman exam Healthy female exam - Ambulatory referral to Gastroenterology  3. Encounter for screening mammogram for malignant neoplasm of breast - MM 3D SCREEN BREAST BILATERAL; Future  4. Encounter for counseling regarding contraception Cont OCPs   5. Encounter for screening colonoscopy  6. Needs flu shot - Flu Vaccine QUAD 6mo+IM (Fluarix, Fluzone & Alfiuria Quad PF)   COVID vaccine  Declines STI screen. Mammogram ordered today Referral for colonoscopy today Flu vaccine today DEXA not due based on age  Routine preventative health maintenance measures emphasized. Please refer to After Visit Summary for other counseling recommendations.    Feliz Beam, MD, [place] for [employer] [facility])

## 2022-08-19 ENCOUNTER — Other Ambulatory Visit: Payer: Self-pay | Admitting: Sports Medicine

## 2022-08-19 ENCOUNTER — Ambulatory Visit (INDEPENDENT_AMBULATORY_CARE_PROVIDER_SITE_OTHER): Payer: BC Managed Care – PPO

## 2022-08-19 ENCOUNTER — Ambulatory Visit: Payer: BC Managed Care – PPO | Admitting: Sports Medicine

## 2022-08-19 DIAGNOSIS — R030 Elevated blood-pressure reading, without diagnosis of hypertension: Secondary | ICD-10-CM

## 2022-08-19 DIAGNOSIS — G8929 Other chronic pain: Secondary | ICD-10-CM

## 2022-08-19 DIAGNOSIS — M25522 Pain in left elbow: Secondary | ICD-10-CM

## 2022-08-19 DIAGNOSIS — Z1231 Encounter for screening mammogram for malignant neoplasm of breast: Secondary | ICD-10-CM | POA: Diagnosis not present

## 2022-08-19 MED ORDER — BLOOD PRESSURE CUFF MISC: 0 refills | Status: DC

## 2022-08-19 NOTE — Assessment & Plan Note (Signed)
Sounds like she has had a couple of elevated blood pressure readings without symptoms, she will check it at home when relaxed for 15 minutes and then send the readings over the next week to her PCP.

## 2022-08-19 NOTE — Assessment & Plan Note (Signed)
This is a very pleasant 46 year old female, she has about a 14-month history of pain left elbow localized laterally, moderate gelling and occasional mechanical symptoms. On exam she has about 5 degrees of extension lag, tenderness when trying to extend past this, no discrete areas of tenderness to palpation but I am able to reproduce her pain with resisted pronation. Differential is broad and includes osteoarthritis, extensor tendinopathy, pronator teres syndrome. She should continue to take the naproxen that was just sent in by her gynecologist, x-rays, home physical therapy, return to see me in 6 weeks, MRI if no better.

## 2022-08-19 NOTE — Progress Notes (Signed)
    Procedures performed today:    None.  Independent interpretation of notes and tests performed by another provider:   None.  Brief History, Exam, Impression, and Recommendations:    Chronic elbow pain, left This is a very pleasant 46 year old female, she has about a 55-month history of pain left elbow localized laterally, moderate gelling and occasional mechanical symptoms. On exam she has about 5 degrees of extension lag, tenderness when trying to extend past this, no discrete areas of tenderness to palpation but I am able to reproduce her pain with resisted pronation. Differential is broad and includes osteoarthritis, extensor tendinopathy, pronator teres syndrome. She should continue to take the naproxen that was just sent in by her gynecologist, x-rays, home physical therapy, return to see me in 6 weeks, MRI if no better.  Elevated blood pressure reading Sounds like she has had a couple of elevated blood pressure readings without symptoms, she will check it at home when relaxed for 15 minutes and then send the readings over the next week to her PCP.    ____________________________________________ Gwen Her. Dianah Field, M.D., ABFM., CAQSM., AME. Primary Care and Sports Medicine [place] [facility]  Adjunct Professor of [place] [employer] [facility] [employer] of Medicine  Risk manager

## 2022-09-15 ENCOUNTER — Other Ambulatory Visit: Payer: Self-pay | Admitting: Obstetrics and Gynecology

## 2022-09-15 DIAGNOSIS — N946 Dysmenorrhea, unspecified: Secondary | ICD-10-CM

## 2022-10-04 ENCOUNTER — Ambulatory Visit: Payer: BC Managed Care – PPO | Admitting: Sports Medicine

## 2022-10-17 ENCOUNTER — Other Ambulatory Visit: Payer: Self-pay | Admitting: Obstetrics and Gynecology

## 2023-02-04 ENCOUNTER — Encounter: Payer: Self-pay | Admitting: Family Medicine

## 2023-02-04 ENCOUNTER — Ambulatory Visit: Payer: BC Managed Care – PPO | Admitting: Family Medicine

## 2023-02-04 VITALS — BP 126/68 | HR 68 | Temp 98.5°F | Ht 62.0 in | Wt 128.1 lb

## 2023-02-04 DIAGNOSIS — B9689 Other specified bacterial agents as the cause of diseases classified elsewhere: Secondary | ICD-10-CM | POA: Diagnosis not present

## 2023-02-04 DIAGNOSIS — J019 Acute sinusitis, unspecified: Secondary | ICD-10-CM

## 2023-02-04 MED ORDER — DOXYCYCLINE HYCLATE 100 MG PO TABS: 100.0000 mg | ORAL_TABLET | Freq: Two times a day (BID) | ORAL | 0 refills | Status: AC

## 2023-02-04 MED ORDER — METHYLPREDNISOLONE 4 MG PO TBPK: ORAL_TABLET | ORAL | 0 refills | Status: DC

## 2023-02-04 NOTE — Progress Notes (Signed)
Acute Office Visit  Subjective:     Patient ID: Ruth Parker, female    DOB: 1976/09/02, 47 y.o.   MRN: [id number]  Chief Complaint  Patient presents with   Sinusitis    X- couple of weeks    HPI Patient is in today for concerns of sinus infection. She has a hx of seasonal allergies. Denies fever, chills. Admits to left sided facial pain. Has been blowing out brown snot.   Review of Systems  Constitutional:  Negative for chills and fever.  HENT:  Positive for congestion and sinus pain.   Respiratory:  Negative for cough and shortness of breath.   Cardiovascular:  Negative for chest pain.  Neurological:  Negative for headaches.        Objective:    BP 126/68 (BP Location: Left Arm, Patient Position: Sitting, Cuff Size: Normal)   Pulse 68   Temp 98.5 F (36.9 C) (Oral)   Ht 5\' 2"  (1.575 m)   Wt 128 lb 1.9 oz (58.1 kg)   SpO2 100%   BMI 23.43 kg/m    Physical Exam Vitals and nursing note reviewed.  Constitutional:      General: She is not in acute distress.    Appearance: Normal appearance.  HENT:     Head: Normocephalic and atraumatic.     Comments: Tenderness to palpation of maxillary and frontal sinuses bilaterally    Right Ear: Tympanic membrane, ear canal and external ear normal.     Left Ear: Tympanic membrane, ear canal and external ear normal.     Nose: Nose normal.  Eyes:     Conjunctiva/sclera: Conjunctivae normal.  Cardiovascular:     Rate and Rhythm: Normal rate and regular rhythm.  Pulmonary:     Effort: Pulmonary effort is normal.     Breath sounds: Normal breath sounds.  Neurological:     General: No focal deficit present.     Mental Status: She is alert and oriented to person, place, and time.  Psychiatric:        Mood and Affect: Mood normal.        Behavior: Behavior normal.        Thought Content: Thought content normal.        Judgment: Judgment normal.     No results found for any visits on 02/04/23.      Assessment & Plan:    Problem List Items Addressed This Visit       Respiratory   Acute bacterial sinusitis - Primary    - due to symptoms and duration will go ahead and treat for bacterial sinusitis. Given doxycycline  - recommended taking with food  - if no better may need to go to ENT for sinus clean out  - given medrol dose pack to help decrease inflammation      Relevant Medications   doxycycline (VIBRA-TABS) 100 MG tablet   methylPREDNISolone (MEDROL DOSEPAK) 4 MG TBPK tablet    Meds ordered this encounter  Medications   doxycycline (VIBRA-TABS) 100 MG tablet    Sig: Take 1 tablet (100 mg total) by mouth 2 (two) times daily for 7 days.    Dispense:  14 tablet    Refill:  0   methylPREDNISolone (MEDROL DOSEPAK) 4 MG TBPK tablet    Sig: Take as directed on dose pack    Dispense:  21 tablet    Refill:  0    Return if symptoms worsen or fail to improve.  Owens Loffler,  DO

## 2023-02-04 NOTE — Assessment & Plan Note (Signed)
-   due to symptoms and duration will go ahead and treat for bacterial sinusitis. Given doxycycline  - recommended taking with food  - if no better may need to go to ENT for sinus clean out  - given medrol dose pack to help decrease inflammation

## 2023-06-02 ENCOUNTER — Ambulatory Visit: Payer: BC Managed Care – PPO | Admitting: Medical-Surgical

## 2023-06-02 VITALS — BP 130/80 | HR 87 | Temp 99.1°F | Ht 62.0 in | Wt 129.6 lb

## 2023-06-02 DIAGNOSIS — B9689 Other specified bacterial agents as the cause of diseases classified elsewhere: Secondary | ICD-10-CM | POA: Diagnosis not present

## 2023-06-02 DIAGNOSIS — J019 Acute sinusitis, unspecified: Secondary | ICD-10-CM

## 2023-06-02 DIAGNOSIS — Z1211 Encounter for screening for malignant neoplasm of colon: Secondary | ICD-10-CM

## 2023-06-02 MED ORDER — AMOXICILLIN-POT CLAVULANATE 875-125 MG PO TABS: 1.0000 | ORAL_TABLET | Freq: Two times a day (BID) | ORAL | 0 refills | Status: DC

## 2023-06-02 MED ORDER — FLUCONAZOLE 150 MG PO TABS: 150.0000 mg | ORAL_TABLET | Freq: Once | ORAL | 0 refills | Status: AC

## 2023-06-02 MED ORDER — PREDNISONE 50 MG PO TABS: 50.0000 mg | ORAL_TABLET | Freq: Every day | ORAL | 0 refills | Status: DC

## 2023-06-02 NOTE — Progress Notes (Addendum)
Established patient visit  History, exam, impression, and plan:  1. Acute bacterial sinusitis Very pleasant 47 year old female presenting today with reports of 3-4 weeks of upper respiratory symptoms including low-grade fever, sore throat, headache, dental pain, sinus drainage, facial pressure, and nonproductive cough.  Has had no chills, nausea, vomiting, or diarrhea.  Eating and drinking without difficulty.  Has tried using a Nettie pot and notes that 1 side of her nose will drain and the other will not.  Also used Zyrtec and Flonase with minimal relief of symptoms.  Over the past couple of days, she feels like there is a very tender spot just inside the right ear that felt like a bubble.  See below for physical exam.  Treating for bacterial sinusitis with Augmentin twice daily x 10 days.  Adding Diflucan for VVC prophylaxis.  Adding prednisone 50 mg daily x 5 days.  Okay to continue Zyrtec, Flonase, and Nettie pot rinses.  2. Colon cancer screening Asking about recommendations for colon cancer screening today.  She is interested in Cologuard however she does have a family history of colon problems in her mother.  Also notes that she has had a history of GI symptoms herself.  Reviewed recommendations for Cologuard and low risk individuals.  Ultimately, would recommend she have a colonoscopy as this is the gold standard.  Patient is agreeable so referral placed to GI today. - Ambulatory referral to Gastroenterology  Physical Exam Vitals reviewed.  Constitutional:      General: She is not in acute distress.    Appearance: Normal appearance. She is not ill-appearing.  HENT:     Head: Normocephalic and atraumatic.     Right Ear: Ear canal normal. A middle ear effusion is present. There is no impacted cerumen.     Left Ear: Ear canal and external ear normal. A middle ear effusion is present. There is no impacted cerumen.     Ears:     Comments: Small ruptured erythematous pustule just  inside the right ear canal    Nose: Congestion present. No rhinorrhea.     Mouth/Throat:     Mouth: Mucous membranes are moist.     Pharynx: Posterior oropharyngeal erythema present. No oropharyngeal exudate.  Eyes:     General: No scleral icterus.       Right eye: No discharge.        Left eye: No discharge.     Extraocular Movements: Extraocular movements intact.     Conjunctiva/sclera: Conjunctivae normal.     Pupils: Pupils are equal, round, and reactive to light.  Cardiovascular:     Rate and Rhythm: Normal rate and regular rhythm.     Pulses: Normal pulses.     Heart sounds: Normal heart sounds. No murmur heard.    No friction rub. No gallop.  Pulmonary:     Effort: Pulmonary effort is normal. No respiratory distress.     Breath sounds: Normal breath sounds. No wheezing.  Musculoskeletal:     Cervical back: Neck supple.  Lymphadenopathy:     Cervical: Cervical adenopathy present.  Skin:    General: Skin is warm and dry.  Neurological:     Mental Status: She is alert and oriented to person, place, and time.  Psychiatric:        Mood and Affect: Mood normal.        Behavior: Behavior normal.        Thought Content: Thought content normal.  Judgment: Judgment normal.   Procedures performed this visit: None.  Return if symptoms worsen or fail to improve.  __________________________________ Thayer Ohm, DNP, APRN, FNP-BC Primary Care and Sports Medicine [facility] [place]

## 2023-06-12 ENCOUNTER — Other Ambulatory Visit: Payer: Self-pay | Admitting: Family Medicine

## 2023-06-12 DIAGNOSIS — N946 Dysmenorrhea, unspecified: Secondary | ICD-10-CM

## 2023-07-09 ENCOUNTER — Other Ambulatory Visit: Payer: Self-pay

## 2023-07-09 ENCOUNTER — Ambulatory Visit
Admission: RE | Admit: 2023-07-09 | Discharge: 2023-07-09 | Disposition: A | Discharge status: Other Acute Inpatient Hospital | Payer: BC Managed Care – PPO | Source: Ambulatory Visit | Attending: Family Medicine | Admitting: Family Medicine

## 2023-07-09 VITALS — BP 121/80 | HR 75 | Temp 98.3°F | Resp 16

## 2023-07-09 DIAGNOSIS — R4182 Altered mental status, unspecified: Secondary | ICD-10-CM | POA: Diagnosis not present

## 2023-07-09 DIAGNOSIS — J329 Chronic sinusitis, unspecified: Secondary | ICD-10-CM | POA: Diagnosis not present

## 2023-07-09 DIAGNOSIS — R200 Anesthesia of skin: Secondary | ICD-10-CM | POA: Diagnosis not present

## 2023-07-09 DIAGNOSIS — R202 Paresthesia of skin: Secondary | ICD-10-CM | POA: Diagnosis not present

## 2023-07-09 DIAGNOSIS — R220 Localized swelling, mass and lump, head: Secondary | ICD-10-CM | POA: Diagnosis not present

## 2023-07-09 DIAGNOSIS — Z8709 Personal history of other diseases of the respiratory system: Secondary | ICD-10-CM | POA: Diagnosis not present

## 2023-07-09 DIAGNOSIS — J019 Acute sinusitis, unspecified: Secondary | ICD-10-CM | POA: Diagnosis not present

## 2023-07-09 DIAGNOSIS — H02846 Edema of left eye, unspecified eyelid: Secondary | ICD-10-CM | POA: Diagnosis not present

## 2023-07-09 NOTE — ED Notes (Signed)
Patient is being discharged from the Urgent Care and sent to the Emergency Department via POV per patient request, does not want ambulance . Per Dr. Delton See, patient is in need of higher level of care due to severity of symptoms. Patient is aware and verbalizes understanding of plan of care.  Vitals:   07/09/23 0812  BP: 121/80  Pulse: 75  Resp: 16  Temp: 98.3 F (36.8 C)  SpO2: 98%

## 2023-07-09 NOTE — Discharge Instructions (Signed)
Go to the ER for additional testing

## 2023-07-09 NOTE — ED Provider Notes (Signed)
[facility]    CSN: [id number] Arrival date & time: 07/09/23  0759      History   Chief Complaint Chief Complaint  Patient presents with   Eye Problem    Numbness in face - Entered by patient   Numbness    HPI Ruth Parker is a 47 y.o. female.   HPI  Very pleasant 47 year old woman.  She is in good health with only medication being birth control pills.  She does have recurring sinus infections.  She was seen by her primary care doctor on 06/05/2023 and treated for acute Titus.  She states that she felt better after treatment but over the last couple of days has felt congested again, more on the left side of her face.  She has developed numbness on the left side of her face.  She notes that the muscles work normally.  Her face feels numb from the eye down to the angle of the jaw, and from the nose around to the ear (diagrammed).  She states she is also having symptoms in her left arm, intermittent feelings of tingling in the fingers. No vision change no other numbness or weakness of extremities, no problems with thinking or cognition.  She states that her left eye is a little bit irritated and red.  Past Medical History:  Diagnosis Date   Allergy    Anemia 03/02/10   History of migraines    Seeing neurologist   Thyroid disease    abn labs by pcp   Vaginal Pap smear, abnormal     Patient Active Problem List   Diagnosis Date Noted   Acute bacterial sinusitis 02/04/2023   Chronic elbow pain, left 08/19/2022   Elevated blood pressure reading 08/19/2022   Acute maxillary sinusitis 04/21/2022   Deviated septum 03/02/2018   Allergic rhinitis 03/01/2018   Eustachian tube disorder, bilateral 03/01/2018   Bloating 01/12/2017   Difficulty in urination 01/12/2017   Heart murmur on physical examination 05/17/2016    Past Surgical History:  Procedure Laterality Date   DILATION AND CURETTAGE OF UTERUS     elective AB    OB History     Gravida  2   Para  1    Term      Preterm      AB  1   Living  1      SAB      IAB  1   Ectopic      Multiple      Live Births               Home Medications    Prior to Admission medications   Medication Sig Start Date End Date Taking? Authorizing Provider  acetaminophen (TYLENOL) 325 MG tablet Take 650 mg by mouth every 6 (six) hours as needed.    [provider]  drospirenone-ethinyl estradiol (NIKKI) 3-0.02 MG tablet TAKE 1 TABLET BY MOUTH EVERY DAY 10/18/22   Reva Bores, MD  naproxen (NAPROSYN) 500 MG tablet TAKE 1 TABLET BY MOUTH 3 TIMES DAILY AS NEEDED. (INS. ONLY COVERS 2 PER DAY) 06/13/23   Reva Bores, MD    Family History Family History  Problem Relation Age of Onset   Breast cancer Maternal Grandmother    Cancer Maternal Grandmother        breast   Breast cancer Maternal Aunt    Cancer Maternal Aunt        breast   Glaucoma Mother  Hypertension Mother    Diabetes Maternal Grandfather    Stroke Maternal Grandfather    Stroke Paternal Grandmother    Diabetes Paternal Grandfather    Stroke Paternal Grandfather     Social History Social History   Tobacco Use   Smoking status: Never   Smokeless tobacco: Never  Vaping Use   Vaping status: Never Used  Substance Use Topics   Alcohol use: Yes    Alcohol/week: 0.0 standard drinks of alcohol    Comment: Occasionally   Drug use: No     Allergies   Percocet [oxycodone-acetaminophen]   Review of Systems Review of Systems See HPI  Physical Exam Triage Vital Signs ED Triage Vitals  Encounter Vitals Group     BP 07/09/23 0812 121/80     Systolic BP Percentile --      Diastolic BP Percentile --      Pulse Rate 07/09/23 0812 75     Resp 07/09/23 0812 16     Temp 07/09/23 0812 98.3 F (36.8 C)     Temp Source 07/09/23 0812 Oral     SpO2 07/09/23 0812 98 %     Weight --      Height --      Head Circumference --      Peak Flow --      Pain Score 07/09/23 0813 0     Pain Loc --      Pain  Education --      Exclude from Growth Chart --    No data found.  Updated Vital Signs BP 121/80 (BP Location: Left Arm)   Pulse 75   Temp 98.3 F (36.8 C) (Oral)   Resp 16   SpO2 98%       Physical Exam Constitutional:      General: She is not in acute distress.    Appearance: Normal appearance. She is well-developed.     Comments: Looks well.  Lucid.  HENT:     Head: Normocephalic and atraumatic.      Right Ear: Tympanic membrane and ear canal normal.     Left Ear: Tympanic membrane and ear canal normal.     Nose: Congestion present.     Mouth/Throat:     Mouth: Mucous membranes are moist.     Pharynx: No posterior oropharyngeal erythema.  Eyes:     Conjunctiva/sclera: Conjunctivae normal.     Pupils: Pupils are equal, round, and reactive to light.  Cardiovascular:     Rate and Rhythm: Normal rate.  Pulmonary:     Effort: Pulmonary effort is normal. No respiratory distress.  Abdominal:     General: There is no distension.     Palpations: Abdomen is soft.  Musculoskeletal:        General: Normal range of motion.     Cervical back: Normal range of motion.  Lymphadenopathy:     Cervical: No cervical adenopathy.  Skin:    General: Skin is warm and dry.  Neurological:     Mental Status: She is alert.     Cranial Nerves: Cranial nerve deficit present.     Sensory: Sensory deficit present.     Motor: No weakness.     Gait: Gait normal.     Deep Tendon Reflexes: Reflexes normal.      UC Treatments / Results  Labs (all labs ordered are listed, but only abnormal results are displayed) Labs Reviewed - No data to display  EKG   Radiology No results found.  Procedures Procedures (including critical care time)  Medications Ordered in UC Medications - No data to display  Initial Impression / Assessment and Plan / UC Course  I have reviewed the triage vital signs and the nursing notes.  Pertinent labs & imaging results that were available during my care of  the patient were reviewed by me and considered in my medical decision making (see chart for details).     Patient thinks she just has a sinus infection.  I explained to her that numbness is not normal with a sinus infection.  Especially if she is having vague arm symptoms.  She states that she has vague arm symptoms frequently.  I feel she needs additional evaluation and have recommended she go to the emergency room.  they likely will do a CAT scan to evaluate the nerve symptoms. I do not believe she is having a stroke.  The symptoms have been stable over 2 days. Final Clinical Impressions(s) / UC Diagnoses   Final diagnoses:  Numbness and tingling of left side of face     Discharge Instructions      Go to the ER for additional testing   ED Prescriptions   None    PDMP not reviewed this encounter.   Eustace Moore, MD 07/09/23 [phone]

## 2023-07-09 NOTE — ED Notes (Addendum)
Patient states she will call her husband to pick her up and drive her to [facility] since she does not want an ambulance called.

## 2023-07-09 NOTE — ED Triage Notes (Addendum)
Left eye redness intermittently and left side of face numb x "few days". Had recent sinus infection. No itching or pain.

## 2023-07-09 NOTE — ED Notes (Addendum)
Visual acuity: Uncorrected: OD 20/25, OS 20/25, OU 20/25 Corrected: OD 20/20, OS 20/20, OU 20/20  Wears glasses

## 2023-09-19 ENCOUNTER — Other Ambulatory Visit: Payer: Self-pay | Admitting: *Deleted

## 2023-09-19 MED ORDER — DROSPIRENONE-ETHINYL ESTRADIOL 3-0.02 MG PO TABS: 1.0000 | ORAL_TABLET | Freq: Every day | ORAL | 3 refills | Status: DC

## 2023-09-19 NOTE — Telephone Encounter (Signed)
Pt called requesting a RF on her [facility].  1 RF given and pt is overdue for her annual which was 9/23.

## 2023-10-02 ENCOUNTER — Other Ambulatory Visit: Payer: Self-pay

## 2023-10-02 ENCOUNTER — Ambulatory Visit
Admission: EM | Admit: 2023-10-02 | Discharge: 2023-10-02 | Disposition: A | Discharge status: Home / Self Care | Payer: BC Managed Care – PPO | Attending: Family Medicine | Admitting: Family Medicine

## 2023-10-02 DIAGNOSIS — Z9109 Other allergy status, other than to drugs and biological substances: Secondary | ICD-10-CM | POA: Diagnosis not present

## 2023-10-02 DIAGNOSIS — N390 Urinary tract infection, site not specified: Secondary | ICD-10-CM

## 2023-10-02 DIAGNOSIS — B9689 Other specified bacterial agents as the cause of diseases classified elsewhere: Secondary | ICD-10-CM | POA: Diagnosis not present

## 2023-10-02 LAB — POCT URINALYSIS DIP (MANUAL ENTRY)
Bilirubin, UA: NEGATIVE
Glucose, UA: NEGATIVE mg/dL
Ketones, POC UA: NEGATIVE mg/dL
Nitrite, UA: NEGATIVE
Protein Ur, POC: NEGATIVE mg/dL
Spec Grav, UA: 1.01 (ref 1.010–1.025)
Urobilinogen, UA: 0.2 U/dL
pH, UA: 6 (ref 5.0–8.0)

## 2023-10-02 MED ORDER — NITROFURANTOIN MONOHYD MACRO 100 MG PO CAPS: 100.0000 mg | ORAL_CAPSULE | Freq: Two times a day (BID) | ORAL | 0 refills | Status: DC

## 2023-10-02 MED ORDER — MOMETASONE FUROATE 50 MCG/ACT NA SUSP: 2.0000 | Freq: Every day | NASAL | 1 refills | Status: AC

## 2023-10-02 NOTE — Discharge Instructions (Signed)
Drink lots of water Take the nitrofurantoin 2 times a day Check culture report in 2 to 3 days.  A nurse will call you if a change in antibiotics is indicated  I have refilled your mometasone nasal spray for allergies

## 2023-10-02 NOTE — ED Triage Notes (Signed)
Urinary frequency with burning urination, cloudy urine x a couple of days. No fever.

## 2023-10-02 NOTE — ED Provider Notes (Signed)
[facility]    CSN: [id number] Arrival date & time: 10/02/23  1353      History   Chief Complaint Chief Complaint  Patient presents with   Dysuria    HPI Ruth Parker is a 47 y.o. female.   HPI  Patient has symptoms consistent urinary tract.  She has burning and frequency, cloudy urine, slight suprapubic pressure.  No fever or flank pain to suggest kidney involvement  Past Medical History:  Diagnosis Date   Allergy    Anemia 03/02/10   History of migraines    Seeing neurologist   Thyroid disease    abn labs by pcp   Vaginal Pap smear, abnormal     Patient Active Problem List   Diagnosis Date Noted   Acute bacterial sinusitis 02/04/2023   Chronic elbow pain, left 08/19/2022   Elevated blood pressure reading 08/19/2022   Acute maxillary sinusitis 04/21/2022   Deviated septum 03/02/2018   Allergic rhinitis 03/01/2018   Eustachian tube disorder, bilateral 03/01/2018   Bloating 01/12/2017   Difficulty in urination 01/12/2017   Heart murmur on physical examination 05/17/2016    Past Surgical History:  Procedure Laterality Date   DILATION AND CURETTAGE OF UTERUS     elective AB    OB History     Gravida  2   Para  1   Term      Preterm      AB  1   Living  1      SAB      IAB  1   Ectopic      Multiple      Live Births               Home Medications    Prior to Admission medications   Medication Sig Start Date End Date Taking? Authorizing Provider  mometasone (NASONEX) 50 MCG/ACT nasal spray Place 2 sprays into the nose daily. 10/02/23  Yes Eustace Moore, MD  nitrofurantoin, macrocrystal-monohydrate, (MACROBID) 100 MG capsule Take 1 capsule (100 mg total) by mouth 2 (two) times daily. 10/02/23  Yes Eustace Moore, MD  drospirenone-ethinyl estradiol (NIKKI) 3-0.02 MG tablet Take 1 tablet by mouth daily. 09/19/23   Rasch, Harolyn Rutherford, NP    Family History Family History  Problem Relation Age of Onset   Breast  cancer Maternal Grandmother    Cancer Maternal Grandmother        breast   Breast cancer Maternal Aunt    Cancer Maternal Aunt        breast   Glaucoma Mother    Hypertension Mother    Diabetes Maternal Grandfather    Stroke Maternal Grandfather    Stroke Paternal Grandmother    Diabetes Paternal Grandfather    Stroke Paternal Grandfather     Social History Social History   Tobacco Use   Smoking status: Never   Smokeless tobacco: Never  Vaping Use   Vaping status: Never Used  Substance Use Topics   Alcohol use: Yes    Alcohol/week: 0.0 standard drinks of alcohol    Comment: Occasionally   Drug use: No     Allergies   Percocet [oxycodone-acetaminophen]   Review of Systems Review of Systems See HPI  Physical Exam Triage Vital Signs ED Triage Vitals  Encounter Vitals Group     BP 10/02/23 1405 (!) 145/80     Systolic BP Percentile --      Diastolic BP Percentile --  Pulse Rate 10/02/23 1405 71     Resp 10/02/23 1405 16     Temp 10/02/23 1405 97.6 F (36.4 C)     Temp Source 10/02/23 1405 Oral     SpO2 10/02/23 1405 100 %     Weight --      Height --      Head Circumference --      Peak Flow --      Pain Score 10/02/23 1408 5     Pain Loc --      Pain Education --      Exclude from Growth Chart --    No data found.  Updated Vital Signs BP (!) 145/80   Pulse 71   Temp 97.6 F (36.4 C) (Oral)   Resp 16   SpO2 100%      Physical Exam Constitutional:      General: She is not in acute distress.    Appearance: She is well-developed.  HENT:     Head: Normocephalic and atraumatic.  Eyes:     Conjunctiva/sclera: Conjunctivae normal.     Pupils: Pupils are equal, round, and reactive to light.  Cardiovascular:     Rate and Rhythm: Normal rate.  Pulmonary:     Effort: Pulmonary effort is normal. No respiratory distress.  Abdominal:     General: There is no distension.     Palpations: Abdomen is soft.     Tenderness: There is no abdominal  tenderness. There is no right CVA tenderness or left CVA tenderness.  Musculoskeletal:        General: Normal range of motion.     Cervical back: Normal range of motion.  Skin:    General: Skin is warm and dry.  Neurological:     Mental Status: She is alert.      UC Treatments / Results  Labs (all labs ordered are listed, but only abnormal results are displayed) Labs Reviewed  POCT URINALYSIS DIP (MANUAL ENTRY) - Abnormal; Notable for the following components:      Result Value   Clarity, UA cloudy (*)    Blood, UA moderate (*)    Leukocytes, UA Large (3+) (*)    All other components within normal limits  URINE CULTURE    EKG   Radiology No results found.  Procedures Procedures (including critical care time)  Medications Ordered in UC Medications - No data to display  Initial Impression / Assessment and Plan / UC Course  I have reviewed the triage vital signs and the nursing notes.  Pertinent labs & imaging results that were available during my care of the patient were reviewed by me and considered in my medical decision making (see chart for details).     Patient has symptoms consistent with urinary tract infection.  No evidence of pyelonephritis.  Will treat with antibiotics.  Culture is sent to the laboratory Final Clinical Impressions(s) / UC Diagnoses   Final diagnoses:  Lower urinary tract infectious disease  Environmental allergies     Discharge Instructions      Drink lots of water Take the nitrofurantoin 2 times a day Check culture report in 2 to 3 days.  A nurse will call you if a change in antibiotics is indicated  I have refilled your mometasone nasal spray for allergies   ED Prescriptions     Medication Sig Dispense Auth. Provider   mometasone (NASONEX) 50 MCG/ACT nasal spray Place 2 sprays into the nose daily. 1 each Eustace Moore,  MD   nitrofurantoin, macrocrystal-monohydrate, (MACROBID) 100 MG capsule Take 1 capsule (100 mg  total) by mouth 2 (two) times daily. 10 capsule Eustace Moore, MD      PDMP not reviewed this encounter.   Eustace Moore, MD 10/02/23 [phone]

## 2023-10-04 LAB — URINE CULTURE
Culture: 80000 — AB
Special Requests: NORMAL

## 2023-10-17 ENCOUNTER — Other Ambulatory Visit: Payer: Self-pay | Admitting: *Deleted

## 2023-10-17 MED ORDER — DROSPIRENONE-ETHINYL ESTRADIOL 3-0.02 MG PO TABS: 1.0000 | ORAL_TABLET | Freq: Every day | ORAL | 0 refills | Status: DC

## 2023-10-26 ENCOUNTER — Telehealth: Payer: Self-pay | Admitting: *Deleted

## 2023-10-26 NOTE — Telephone Encounter (Signed)
Left patient a message to call and schedule annual on 12/04/2022 with Dr. Berton Lan as requested for date.

## 2023-12-05 ENCOUNTER — Ambulatory Visit (INDEPENDENT_AMBULATORY_CARE_PROVIDER_SITE_OTHER): Payer: BC Managed Care – PPO | Admitting: Obstetrics and Gynecology

## 2023-12-05 ENCOUNTER — Encounter: Payer: Self-pay | Admitting: Obstetrics and Gynecology

## 2023-12-05 VITALS — BP 166/89 | HR 77 | Ht 62.0 in | Wt 127.0 lb

## 2023-12-05 DIAGNOSIS — L989 Disorder of the skin and subcutaneous tissue, unspecified: Secondary | ICD-10-CM | POA: Diagnosis not present

## 2023-12-05 DIAGNOSIS — Z01419 Encounter for gynecological examination (general) (routine) without abnormal findings: Secondary | ICD-10-CM | POA: Diagnosis not present

## 2023-12-05 DIAGNOSIS — R030 Elevated blood-pressure reading, without diagnosis of hypertension: Secondary | ICD-10-CM | POA: Diagnosis not present

## 2023-12-05 DIAGNOSIS — Z1211 Encounter for screening for malignant neoplasm of colon: Secondary | ICD-10-CM

## 2023-12-05 DIAGNOSIS — Z3200 Encounter for pregnancy test, result unknown: Secondary | ICD-10-CM

## 2023-12-05 LAB — POCT URINE PREGNANCY: Preg Test, Ur: NEGATIVE

## 2023-12-05 MED ORDER — NORETHINDRONE 0.35 MG PO TABS
1.0000 | ORAL_TABLET | Freq: Every day | ORAL | 3 refills | Status: AC
Start: 2023-12-05 — End: ?

## 2023-12-05 NOTE — Patient Instructions (Addendum)
 Please follow up with your primary care doctor about your blood pressures.  You can get a blood pressure cuff over the counter. Take your blood pressure once a day and keep a log to bring to your primary care doctor's appointment

## 2023-12-05 NOTE — Progress Notes (Signed)
 ANNUAL EXAM Patient name: Ruth Parker MRN [id number]  Date of birth: [birth year] Chief Complaint:   Annual Exam  History of Present Illness:   Ruth Parker is a 48 y.o. G2P0011 with No LMP recorded (lmp unknown). being seen today for a routine annual exam.  Current complaints: Irregular and heavy bleeding on COCs Lump in neck  Last pap 04/14/21. Results were: NILM w/ HRHPV negative. H/O abnormal pap: yes ASCUS/HPV neg 2020 Last mammogram: 08/19/22. Results were: BI-RADS 1. Family h/o breast cancer: yes mom & aunts. Reports negative BRCA testing Last colonoscopy: never. Results were: N/A. Family h/o colorectal cancer: no     06/02/2023    9:12 AM 02/04/2023    8:29 AM 09/02/2021    2:15 PM 05/08/2019   11:17 AM  Depression screen PHQ 2/9  Decreased Interest 0 0 0 0  Down, Depressed, Hopeless 0 0 0 0  PHQ - 2 Score 0 0 0 0        06/02/2023    9:13 AM  GAD 7 : Generalized Anxiety Score  Nervous, Anxious, on Edge 0  Control/stop worrying 0  Worry too much - different things 0  Trouble relaxing 0  Restless 0  Easily annoyed or irritable 0  Afraid - awful might happen 0  Total GAD 7 Score 0  Anxiety Difficulty Not difficult at all     Review of Systems:   Pertinent items are noted in HPI Denies any headaches, blurred vision, fatigue, shortness of breath, chest pain, abdominal pain, abnormal vaginal discharge/itching/odor/irritation, problems with periods, bowel movements, urination, or intercourse unless otherwise stated above. Pertinent History Reviewed:  Reviewed past medical,surgical, social and family history.  Reviewed problem list, medications and allergies. Physical Assessment:   Vitals:   12/05/23 1440 12/05/23 1526  BP: (!) 152/95 (!) 166/89  Pulse: 77   Weight: 127 lb (57.6 kg)   Height: 5' 2 (1.575 m)   Body mass index is 23.23 kg/m.        Physical Examination:   General appearance - well appearing, and in no distress  HEENT - small 3cm area on R side of  neck lateral to trachea, feels connected to thyroid  - suspect LN vs thyroid  enlargement  Mental status - alert, oriented to person, place, and time  Chest - respiratory effort normal  Heart - normal peripheral perfusion  Breasts - breasts appear normal, no suspicious masses, no skin or nipple changes or axillary nodes  Abdomen - soft, nontender, nondistended, no masses or organomegaly  Pelvic - VULVA: normal appearing vulva with no masses, tenderness or lesions  VAGINA: normal appearing vagina with normal color and discharge, no lesions  CERVIX: normal appearing cervix without discharge or lesions, no CMT  UTERUS: uterus is felt to be normal size, shape, consistency and nontender   ADNEXA: No adnexal masses or tenderness noted.  Chaperone present for exam  Results for orders placed or performed in visit on 12/05/23 (from the past 24 hours)  POCT urine pregnancy   Collection Time: 12/05/23  3:32 PM  Result Value Ref Range   Preg Test, Ur Negative Negative    Assessment & Plan:  1) Well-Woman Exam Mammogram: schedule screening mammo as soon as possible, or sooner if problems Colonoscopy: Declined. Cologard ordered Pap: Due 2027 GC/CT: Declines HIV/HCV: Declines Declined flu shot  2) Elevated blood pressure reading Will dc COCs and transition to POPs PCP follow up Recommended getting OTC BP cuff and bringing log to PCP appt  3)  Neck lump Getting larger per patient Will obtain US   PCP follow up  4) Irregular bleeding on COCs UPT negative Discussed allowing for withdrawal bleed x 1 week. Potentially significantly atrophic endometrium causing BTB If persistent heavy bleeding after starting POPs, will obtain CBC, TSH and pelvic US   Labs/procedures today:   Orders Placed This Encounter  Procedures   MM 3D SCREENING MAMMOGRAM BILATERAL BREAST   US  THYROID    Cologuard   POCT urine pregnancy    Meds:  Meds ordered this encounter  Medications   norethindrone  (MICRONOR ) 0.35  MG tablet    Sig: Take 1 tablet (0.35 mg total) by mouth daily.    Dispense:  90 tablet    Refill:  3    Follow-up: Return in about 1 year (around 12/04/2024) for annual exam.  Ruth JAYSON Carolin, MD 12/05/2023 4:57 PM

## 2024-02-29 ENCOUNTER — Ambulatory Visit (INDEPENDENT_AMBULATORY_CARE_PROVIDER_SITE_OTHER): Admitting: Physician Assistant

## 2024-02-29 ENCOUNTER — Ambulatory Visit: Payer: Self-pay

## 2024-02-29 VITALS — BP 133/87 | HR 82 | Temp 98.3°F | Ht 62.0 in | Wt 125.0 lb

## 2024-02-29 DIAGNOSIS — R6889 Other general symptoms and signs: Secondary | ICD-10-CM

## 2024-02-29 DIAGNOSIS — J309 Allergic rhinitis, unspecified: Secondary | ICD-10-CM | POA: Diagnosis not present

## 2024-02-29 MED ORDER — AMOXICILLIN-POT CLAVULANATE 875-125 MG PO TABS: 1.0000 | ORAL_TABLET | Freq: Two times a day (BID) | ORAL | 0 refills | Status: DC

## 2024-02-29 MED ORDER — PREDNISONE 50 MG PO TABS: ORAL_TABLET | ORAL | 0 refills | Status: DC

## 2024-02-29 NOTE — Telephone Encounter (Signed)
  Chief Complaint: Sinus Congestion and facial Pain Symptoms: post nasal drip, pain in face, head pressure, headache Frequency: 2-3 days Pertinent Negatives: Patient denies fever, CP, SOB Disposition: [] ED /[] Urgent Care (no appt availability in office) / [x] Appointment(In office/virtual)/ []  [facility]/ [] Home Care/ [] Refused Recommended Disposition /[] [facility]/ []  Follow-up with PCP Additional Notes: patient called with concerns for continued sinus congestion and facial pain. Patient endorses post nasal drip, pain in face, head pressure and headache. Patient states she has been using nasal spray and sinus medication with no success. Patient is recommended to be seen within three days but is requesting same day appointment as PCP office does have availability today. Appointment made for today at 3:20 PM with PCP. Patient verbalized understanding of plan and all questions answered.    Copied from CRM [id number]. Topic: Clinical - Red Word Triage >> Feb 29, 2024  9:05 AM Everette Rank wrote: Red Word that prompted transfer to Nurse Triage: Patient Sinues infections Symptoms: Post nasel drip/pain in face/Pressure/Headache 2-3 days gotten worse. Sinues medication not working Reason for Disposition  [1] Using nasal washes and pain medicine > 24 hours AND [2] sinus pain (around cheekbone or eye) persists  Answer Assessment - Initial Assessment Questions 1. LOCATION: "Where does it hurt?"      Under eyes, nose and top of teeth 2. ONSET: "When did the sinus pain start?"  (e.g., hours, days)      Started 2-3 days ago 3. SEVERITY: "How bad is the pain?"   (Scale 1-10; mild, moderate or severe)   - MILD (1-3): doesn't interfere with normal activities    - MODERATE (4-7): interferes with normal activities (e.g., work or school) or awakens from sleep   - SEVERE (8-10): excruciating pain and patient unable to do any normal activities        3-5 of 10 4. RECURRENT SYMPTOM: "Have you ever  had sinus problems before?" If Yes, ask: "When was the last time?" and "What happened that time?"      Yes-last June 5. NASAL CONGESTION: "Is the nose blocked?" If Yes, ask: "Can you open it or must you breathe through your mouth?"     Breathing mostly out of mouth 6. NASAL DISCHARGE: "Do you have discharge from your nose?" If so ask, "What color?"     Clear but there was some green this morning 7. FEVER: "Do you have a fever?" If Yes, ask: "What is it, how was it measured, and when did it start?"      no 8. OTHER SYMPTOMS: "Do you have any other symptoms?" (e.g., sore throat, cough, earache, difficulty breathing)     Post nasal drip  Protocols used: Sinus Pain or Congestion-A-AH

## 2024-02-29 NOTE — Patient Instructions (Signed)

## 2024-02-29 NOTE — Progress Notes (Unsigned)
   Acute Office Visit  Subjective:     Patient ID: Ruth Parker, female    DOB: June 02, 1976, 48 y.o.   MRN: [id number]  Chief Complaint  Patient presents with   Cough    Sinus Pain and Congestion    HPI Patient is in today for cough, sinus pressure and congestion for the last week. Symptoms started like allergies and progressed into worsening cough, sinus pressure and drainage. No fever, chills, body aches. She is taking zyrtec and nasonex without relief.   ROS See HPI.      Objective:    BP 133/87   Pulse 82   Temp 98.3 F (36.8 C) (Temporal)   Ht 5\' 2"  (1.575 m)   Wt 125 lb (56.7 kg)   SpO2 99%   BMI 22.86 kg/m  BP Readings from Last 3 Encounters:  02/29/24 133/87  12/05/23 (!) 166/89  10/02/23 (!) 145/80   Wt Readings from Last 3 Encounters:  02/29/24 125 lb (56.7 kg)  12/05/23 127 lb (57.6 kg)  06/02/23 129 lb 9.6 oz (58.8 kg)      Physical Exam Constitutional:      Appearance: Normal appearance.  HENT:     Head: Normocephalic.     Comments: Tenderness over bilateral maxillary sinuses    Right Ear: Ear canal and external ear normal. There is no impacted cerumen.     Left Ear: Ear canal and external ear normal. There is no impacted cerumen.     Ears:     Comments: Bilateral dull and erythematous.     Nose: Congestion and rhinorrhea present.     Mouth/Throat:     Mouth: Mucous membranes are moist.  Eyes:     Extraocular Movements: Extraocular movements intact.     Pupils: Pupils are equal, round, and reactive to light.     Comments: Injected and watery conjunctiva  Cardiovascular:     Rate and Rhythm: Normal rate and regular rhythm.  Pulmonary:     Effort: Pulmonary effort is normal.     Breath sounds: Normal breath sounds.  Musculoskeletal:     Cervical back: Normal range of motion and neck supple.     Right lower leg: No edema.     Left lower leg: No edema.  Lymphadenopathy:     Cervical: No cervical adenopathy.  Skin:    Comments: Chapped  skin under nares  Neurological:     General: No focal deficit present.     Mental Status: She is alert and oriented to person, place, and time.  Psychiatric:        Mood and Affect: Mood normal.         Assessment & Plan:  Marland KitchenMarland KitchenReva was seen today for cough.  Diagnoses and all orders for this visit:  Allergic sinusitis -     predniSONE (DELTASONE) 50 MG tablet; One tab PO daily for 5 days. -     amoxicillin-clavulanate (AUGMENTIN) 875-125 MG tablet; Take 1 tablet by mouth 2 (two) times daily.  Other orders -     Cancel: POC COVID-19 BinaxNow -     Cancel: POCT Influenza A/B -     Cancel: POCT rapid strep A   Suspect allergies with secondary bacterial sinusitis Start prednisone first and if not improving move to augmentin Continue nasonex Continue symptomatic care Follow up as needed or if symptoms persist or worsen  Tandy Gaw, PA-C

## 2024-03-01 ENCOUNTER — Encounter: Payer: Self-pay | Admitting: Physician Assistant

## 2024-03-29 ENCOUNTER — Telehealth: Payer: Self-pay

## 2024-03-29 NOTE — Telephone Encounter (Signed)
 Patient would like to switch PCP's from [facility] to [facility], patient states that she would feel more comfortable with [facility], ok to do so?

## 2024-05-29 NOTE — Addendum Note (Signed)
 Addended by: ORLINDA SILVANO ORN on: 05/29/2024 04:41 PM   Modules accepted: Orders

## 2024-06-25 ENCOUNTER — Ambulatory Visit: Payer: Self-pay

## 2024-06-25 NOTE — Telephone Encounter (Signed)
 Pt already triaged.  Will close out chart.

## 2024-06-25 NOTE — Telephone Encounter (Signed)
 Message from [facility] sent at 06/25/2024  9:33 AM EDT  Patient states she may have an infection below her nose. States there are little red spots but no drainage. She states that it comes and goes. Patient states that sometimes she has dry patches on her skin so she's not sure if that has something to do with it or not and she also stated that she does blow her nose a lot. Patient wants to make an appt with provider or she does she need to go to urgent care? Please give her a call back at [phone].    Call History  Contact Date/Time Type Contact Phone/Fax By  06/25/2024 09:23 AM EDT Phone (Incoming) Ruth Parker (Self)  Carlin Susanna HERO   Encounter Report  Patient Encounter Report

## 2024-06-25 NOTE — Telephone Encounter (Signed)
 Appointment made for tomorrow 06/26/2024 at 3:40 pm with Dr Dorothyann Byars  FYI Only or Action Required?: FYI only for provider.  Patient was last seen in primary care on 02/29/2024 by Ruth Vermell CROME, PA-C.  Called Nurse Triage reporting Rash.  Symptoms began a couple of days ago but this has appeared three times in the past few months.  Interventions attempted: Rest, hydration, or home remedies.  Symptoms are: unchanged.  Triage Disposition: Home Care  Patient/caregiver understands and will follow disposition?: Yes---Patient wanted to be seen and appointment was made              Copied from CRM #[id number]. Topic: Clinical - Pink Word Triage >> Jun 25, 2024  9:33 AM Ruth Parker wrote: Patient states she may have an infection below her nose. States there are little red spots but no drainage. She states that it comes and goes. Patient states that sometimes she has dry patches on her skin so she's not sure if that has something to do with it or not and she also stated that she does blow her nose a lot. Patient wants to make an appt with provider or she does she need to go to urgent care? Please give her a call back at [phone]. Reason for Disposition . Mild localized rash  Answer Assessment - Initial Assessment Questions Patient thinks this might be dry skin because she has dry skin sometimes and she blows her nose a lot due to allergies She denies any difficulty breathing, swelling, itching, or pain   1. APPEARANCE of RASH: What does the rash look like? (e.g., blisters, dry flaky skin, red spots, redness, sores)     Possibly dry area maybe from blowing nose too much  no drainage 2. LOCATION: Where is the rash located?      Under nose 3. NUMBER: How many spots are there?      Few small ones 4. SIZE: How big are the spots? (e.g., inches, cm; or compare to size of pinhead, tip of pen, eraser, pea)      varies 5. ONSET: When did the rash start?      Off and on  but this one a couple of days ago  but she has had it three months in a row 6. ITCHING: Does the rash itch? If Yes, ask: How bad is the itch?  (Scale 0-10; or none, mild, moderate, severe)     No 7. PAIN: Does the rash hurt? If Yes, ask: How bad is the pain?  (Scale 0-10; or none, mild, moderate, severe)     No 8. OTHER SYMPTOMS: Do you have any other symptoms? (e.g., fever)     No  just some dry patches on skin sometimes 9. PREGNANCY: Is there any chance you are pregnant? When was your last menstrual period?     No--birth control  Protocols used: Rash or Redness - Localized-A-AH

## 2024-06-26 ENCOUNTER — Encounter: Payer: Self-pay | Admitting: Family Medicine

## 2024-06-26 ENCOUNTER — Ambulatory Visit: Admitting: Family Medicine

## 2024-06-26 VITALS — BP 152/79 | HR 76 | Ht 62.0 in | Wt 127.0 lb

## 2024-06-26 DIAGNOSIS — R21 Rash and other nonspecific skin eruption: Secondary | ICD-10-CM

## 2024-06-26 DIAGNOSIS — L710 Perioral dermatitis: Secondary | ICD-10-CM | POA: Insufficient documentation

## 2024-06-26 MED ORDER — CLINDAMYCIN PHOS-BENZOYL PEROX 1.2-5 % EX GEL: 1.0000 | Freq: Two times a day (BID) | CUTANEOUS | 2 refills | Status: AC

## 2024-06-26 NOTE — Patient Instructions (Signed)
 Causes of perioral dermatitis  The cause of perioral dermatitis is unknown. Certain factors such as skin irritants, which cause breakdown of the top layer of the skin, may contribute. People with a history of eczema, which interferes with the barrier function of the skin, are at increased risk.  POD occurs in all ages, sexes, and ethnicities, but is more common in women ages 57 to 43. It is also more common in people who use topical (applied to the skin), oral, or inhaled steroid medications. Other factors that may contribute to POD flares include:  fluoride toothpaste heavy skin moisturizers and cosmetic products, or those that are occlusive (create a barrier on the skin that locks in moisture) oral contraceptives hormone fluctuations in women the presence of certain fungi, bacteria, or mites on the skin problems with the balance of bacteria on your skin, or with your immune system.

## 2024-06-26 NOTE — Progress Notes (Signed)
   Acute Office Visit  Subjective:     Patient ID: Ruth Parker, female    DOB: January 23, 1976, 48 y.o.   MRN: [id number]  Chief Complaint  Patient presents with   Rash    Pt has rash under her nose that started 2-3 days ago she has been using neosporin and alcohol for this.     HPI Patient is in today for rash 2 -3 days, feels dry. Not itching.  Remittent wheezing Neosporin and alcohol on the area but not consistently.  She says usually when it breaks out it last about 5 days and then it tends to go away on its own sometimes it will be weeks between breakouts she has not been able to pinpoint any specific triggers.  She says it is not painful or sore it never blisters.  She just gets raised bumps and then eventually they fade away..  She denies any crusting of the lesions.  ROS      Objective:    BP (!) 152/79   Pulse 76   Ht 5' 2 (1.575 m)   Wt 127 lb (57.6 kg)   SpO2 100%   BMI 23.23 kg/m    Physical Exam HENT:     Head:      Comments: In the area just below her nose going to just the border of her upper lip she has some erythematous papules.  Borderline pustular look on a couple of them.  No open drainage.  No crusting.    No results found for any visits on 06/26/24.      Assessment & Plan:   Problem List Items Addressed This Visit   None Visit Diagnoses       Rash    -  Primary     Perioral dermatitis       Relevant Medications   Clindamycin -Benzoyl Per, Refr, gel      Most consistent with perioral dermatitis.  We discussed that there are some very common triggers.  I did provide a list of some of those in her after visit summary.  Recommend trial of BenzaClin to see if it resolves the outbreaks more quickly.  With twice a day but if irritating to the skin okay is to decrease down to once a day.  Continue to monitor for potential triggers.  Meds ordered this encounter  Medications   Clindamycin -Benzoyl Per, Refr, gel    Sig: Apply 1 Application topically 2  (two) times daily.    Dispense:  45 g    Refill:  2    Return in about 2 weeks (around 07/10/2024) for bp check with nurse.  Dorothyann Byars, MD

## 2024-07-16 ENCOUNTER — Ambulatory Visit

## 2024-07-24 ENCOUNTER — Encounter: Payer: Self-pay | Admitting: Sports Medicine

## 2024-07-30 ENCOUNTER — Encounter: Payer: Self-pay | Admitting: Medical-Surgical

## 2024-07-30 ENCOUNTER — Ambulatory Visit (INDEPENDENT_AMBULATORY_CARE_PROVIDER_SITE_OTHER): Admitting: Medical-Surgical

## 2024-07-30 VITALS — BP 174/97 | HR 78 | Resp 20 | Ht 62.0 in | Wt 125.0 lb

## 2024-07-30 DIAGNOSIS — L209 Atopic dermatitis, unspecified: Secondary | ICD-10-CM

## 2024-07-30 DIAGNOSIS — Z0000 Encounter for general adult medical examination without abnormal findings: Secondary | ICD-10-CM | POA: Diagnosis not present

## 2024-07-30 DIAGNOSIS — I10 Essential (primary) hypertension: Secondary | ICD-10-CM | POA: Insufficient documentation

## 2024-07-30 DIAGNOSIS — E010 Iodine-deficiency related diffuse (endemic) goiter: Secondary | ICD-10-CM | POA: Diagnosis not present

## 2024-07-30 DIAGNOSIS — Z23 Encounter for immunization: Secondary | ICD-10-CM

## 2024-07-30 DIAGNOSIS — R0683 Snoring: Secondary | ICD-10-CM

## 2024-07-30 DIAGNOSIS — R718 Other abnormality of red blood cells: Secondary | ICD-10-CM | POA: Diagnosis not present

## 2024-07-30 MED ORDER — HYDROCHLOROTHIAZIDE 12.5 MG PO TABS: 12.5000 mg | ORAL_TABLET | Freq: Every day | ORAL | 3 refills | Status: DC

## 2024-07-30 NOTE — Progress Notes (Signed)
 Complete physical exam  Patient: Ruth Parker   DOB: 1975/12/17   48 y.o. Female  MRN: [id number]  Subjective:    Chief Complaint  Patient presents with   Annual Exam    Ruth Parker is a 48 y.o. female who presents today for a complete physical exam. She reports consuming a general diet. The patient does not participate in regular exercise at present. She generally feels well. She reports sleeping fairly well. She does have additional problems to discuss today.   Hypertension and associated symptoms - Blood pressure remains consistently elevated at home and in clinic, never reaching as low as 130/80 mmHg - Facial erythema and head pressure, potentially related to hypertension - No palpitations, chest pain, shortness of breath, lightheadedness, dizziness, or vision changes - Diet is low in processed foods and salt, with meals primarily cooked from scratch - Avoids adding salt and rarely eats out, choosing salads and avoiding fried foods - Works on her feet 40-50 hours per week at [employer] - Does not engage in regular exercise outside of work, but occasionally walks - Occasional anxiety and stress, particularly at home, may contribute to elevated blood pressure - Increased irritability and stress with age  2 recent fall risk assessment:    06/26/2024    3:51 PM  Fall Risk   Falls in the past year? 0  Number falls in past yr: 0  Injury with Fall? 0  Risk for fall due to : No Fall Risks  Follow up Falls evaluation completed     Most recent depression screenings:    07/30/2024    2:39 PM 06/26/2024    3:51 PM  PHQ 2/9 Scores  PHQ - 2 Score 0 0    Vision:Within last year and Dental: No current dental problems and Receives regular dental care    Patient Care Team: Willo Mini, NP as PCP - General (Nurse Practitioner)   Outpatient Medications Prior to Visit  Medication Sig   Clindamycin -Benzoyl Per, Refr, gel Apply 1 Application topically 2 (two) times daily.    mometasone  (NASONEX ) 50 MCG/ACT nasal spray Place 2 sprays into the nose daily.   norethindrone  (MICRONOR ) 0.35 MG tablet Take 1 tablet (0.35 mg total) by mouth daily.   No facility-administered medications prior to visit.    Review of Systems  Constitutional:  Negative for chills, fever, malaise/fatigue and weight loss.  HENT:  Negative for congestion, ear pain, hearing loss, sinus pain and sore throat.   Eyes:  Negative for blurred vision, photophobia and pain.  Respiratory:  Negative for cough, shortness of breath and wheezing.   Cardiovascular:  Negative for chest pain, palpitations and leg swelling.  Gastrointestinal:  Negative for abdominal pain, constipation, diarrhea, heartburn, nausea and vomiting.  Genitourinary:  Negative for dysuria, frequency and urgency.  Musculoskeletal:  Negative for falls and neck pain.  Skin:  Positive for rash (patches of bumpy red dry skin on extremities). Negative for itching.  Neurological:  Negative for dizziness, weakness and headaches.  Endo/Heme/Allergies:  Negative for polydipsia. Does not bruise/bleed easily.  Psychiatric/Behavioral:  Negative for depression, substance abuse and suicidal ideas. The patient has insomnia (intermittent). The patient is not nervous/anxious.      Objective:    BP (!) 174/97 (BP Location: Left Arm, Cuff Size: Normal)   Pulse 78   Resp 20   Ht 5' 2 (1.575 m)   Wt 125 lb (56.7 kg)   SpO2 100%   BMI 22.86 kg/m    Physical  Exam Constitutional:      General: She is not in acute distress.    Appearance: Normal appearance. She is not ill-appearing.  HENT:     Head: Normocephalic and atraumatic.     Right Ear: Tympanic membrane, ear canal and external ear normal. There is no impacted cerumen.     Left Ear: Tympanic membrane, ear canal and external ear normal. There is no impacted cerumen.     Nose: Nose normal. No congestion or rhinorrhea.     Mouth/Throat:     Mouth: Mucous membranes are moist.     Pharynx:  No oropharyngeal exudate or posterior oropharyngeal erythema.  Eyes:     General: No scleral icterus.       Right eye: No discharge.        Left eye: No discharge.     Extraocular Movements: Extraocular movements intact.     Conjunctiva/sclera: Conjunctivae normal.     Pupils: Pupils are equal, round, and reactive to light.  Neck:     Thyroid : Thyromegaly (mild) present.     Vascular: No carotid bruit or JVD.     Trachea: Trachea normal.  Cardiovascular:     Rate and Rhythm: Normal rate and regular rhythm.     Pulses: Normal pulses.     Heart sounds: Normal heart sounds. No murmur heard.    No friction rub. No gallop.  Pulmonary:     Effort: Pulmonary effort is normal. No respiratory distress.     Breath sounds: Normal breath sounds. No wheezing.  Abdominal:     General: Bowel sounds are normal. There is no distension.     Palpations: Abdomen is soft.     Tenderness: There is no abdominal tenderness. There is no guarding.  Musculoskeletal:        General: Normal range of motion.     Cervical back: Normal range of motion and neck supple.  Lymphadenopathy:     Cervical: No cervical adenopathy.  Skin:    General: Skin is warm and dry.  Neurological:     Mental Status: She is alert and oriented to person, place, and time.     Cranial Nerves: No cranial nerve deficit.  Psychiatric:        Mood and Affect: Mood normal.        Behavior: Behavior normal.        Thought Content: Thought content normal.        Judgment: Judgment normal.      No results found for any visits on 07/30/24.     Assessment & Plan:    Routine Health Maintenance and Physical Exam  Immunization History  Administered Date(s) Administered   Influenza Split 11/15/2011   Influenza, Seasonal, Injecte, Preservative Fre 07/30/2024   Influenza,inj,Quad PF,6+ Mos 10/08/2014, 10/29/2019, 08/16/2022   Influenza,trivalent, recombinat, inj, PF 11/15/2011   Tdap 05/17/2016    Health Maintenance  Topic Date  Due   HIV Screening  Never done   Hepatitis C Screening  Never done   Hepatitis B Vaccines 19-59 Average Risk (1 of 3 - 19+ 3-dose series) Never done   Colonoscopy  Never done   COVID-19 Vaccine (1 - 2024-25 season) 08/15/2024 (Originally 07/23/2024)   Cervical Cancer Screening (HPV/Pap Cotest)  04/14/2026   DTaP/Tdap/Td (2 - Td or Tdap) 05/17/2026   Influenza Vaccine  Completed   Pneumococcal Vaccine  Aged Out   HPV VACCINES  Aged Out   Meningococcal B Vaccine  Aged Out    Discussed health  benefits of physical activity, and encouraged her to engage in regular exercise appropriate for her age and condition.  1. Annual physical exam (Primary) Checking labs as below. UTD on preventative care. Wellness information provided with AVS. - CBC - CMP14+EGFR - Lipid panel  2. Thyromegaly Checking TSH. Encouraged completion of the ordered thyroid  US  (by OBGYN). - TSH  3. Hypertension Consistently elevated blood pressure. Discussed potential symptoms and complications of untreated hypertension. She is interested in starting medication. - Prescribed hydrochlorothiazide  12.5 mg daily. - Advised home blood pressure monitoring and reporting. - Scheduled follow-up in a few weeks for blood pressure check.  4. Atopic dermatitis Dry patches and bumps on skin not present today but likely related to atopic dermatitis. Discussed skin care regimen. - Advise use of moisturizing lotion after bathing. - Consider skin scraping or biopsy if symptoms persist.  5. Snoring Possibly related to deviated septum. Moderate risk for sleep apnea. Discussed potential interventions. - Consider sleep study to evaluate for sleep apnea. - Consider use of mouth guard to reduce snoring.  6. Need for influenza vaccination Flu vaccine given in office today.  - Flu vaccine trivalent PF, 6mos and older(Flulaval,Afluria,Fluarix,Fluzone)  Return in about 2 weeks (around 08/13/2024) for nurse visit for BP check.     Willette Mudry, NP

## 2024-07-30 NOTE — Patient Instructions (Signed)
 Preventive Care 58-47 Years Old, Female  Preventive care refers to lifestyle choices and visits with your health care provider that can promote health and wellness. Preventive care visits are also called wellness exams.  What can I expect for my preventive care visit?  Counseling  Your health care provider may ask you questions about your:  Medical history, including:  Past medical problems.  Family medical history.  Pregnancy history.  Current health, including:  Menstrual cycle.  Method of birth control.  Emotional well-being.  Home life and relationship well-being.  Sexual activity and sexual health.  Lifestyle, including:  Alcohol, nicotine or tobacco, and drug use.  Access to firearms.  Diet, exercise, and sleep habits.  Work and work Astronomer.  Sunscreen use.  Safety issues such as seatbelt and bike helmet use.  Physical exam  Your health care provider will check your:  Height and weight. These may be used to calculate your BMI (body mass index). BMI is a measurement that tells if you are at a healthy weight.  Waist circumference. This measures the distance around your waistline. This measurement also tells if you are at a healthy weight and may help predict your risk of certain diseases, such as type 2 diabetes and high blood pressure.  Heart rate and blood pressure.  Body temperature.  Skin for abnormal spots.  What immunizations do I need?    Vaccines are usually given at various ages, according to a schedule. Your health care provider will recommend vaccines for you based on your age, medical history, and lifestyle or other factors, such as travel or where you work.  What tests do I need?  Screening  Your health care provider may recommend screening tests for certain conditions. This may include:  Lipid and cholesterol levels.  Diabetes screening. This is done by checking your blood sugar (glucose) after you have not eaten for a while (fasting).  Pelvic exam and Pap test.  Hepatitis B test.  Hepatitis C  test.  HIV (human immunodeficiency virus) test.  STI (sexually transmitted infection) testing, if you are at risk.  Lung cancer screening.  Colorectal cancer screening.  Mammogram. Talk with your health care provider about when you should start having regular mammograms. This may depend on whether you have a family history of breast cancer.  BRCA-related cancer screening. This may be done if you have a family history of breast, ovarian, tubal, or peritoneal cancers.  Bone density scan. This is done to screen for osteoporosis.  Talk with your health care provider about your test results, treatment options, and if necessary, the need for more tests.  Follow these instructions at home:  Eating and drinking    Eat a diet that includes fresh fruits and vegetables, whole grains, lean protein, and low-fat dairy products.  Take vitamin and mineral supplements as recommended by your health care provider.  Do not drink alcohol if:  Your health care provider tells you not to drink.  You are pregnant, may be pregnant, or are planning to become pregnant.  If you drink alcohol:  Limit how much you have to 0-1 drink a day.  Know how much alcohol is in your drink. In the [place] one drink equals one 12 oz bottle of beer (355 mL), one 5 oz glass of wine (148 mL), or one 1 oz glass of hard liquor (44 mL).  Lifestyle  Brush your teeth every morning and night with fluoride toothpaste. Floss one time each day.  Exercise for at least  30 minutes 5 or more days each week.  Do not use any products that contain nicotine or tobacco. These products include cigarettes, chewing tobacco, and vaping devices, such as e-cigarettes. If you need help quitting, ask your health care provider.  Do not use drugs.  If you are sexually active, practice safe sex. Use a condom or other form of protection to prevent STIs.  If you do not wish to become pregnant, use a form of birth control. If you plan to become pregnant, see your health care provider for a  prepregnancy visit.  Take aspirin only as told by your health care provider. Make sure that you understand how much to take and what form to take. Work with your health care provider to find out whether it is safe and beneficial for you to take aspirin daily.  Find healthy ways to manage stress, such as:  Meditation, yoga, or listening to music.  Journaling.  Talking to a trusted person.  Spending time with friends and family.  Minimize exposure to UV radiation to reduce your risk of skin cancer.  Safety  Always wear your seat belt while driving or riding in a vehicle.  Do not drive:  If you have been drinking alcohol. Do not ride with someone who has been drinking.  When you are tired or distracted.  While texting.  If you have been using any mind-altering substances or drugs.  Wear a helmet and other protective equipment during sports activities.  If you have firearms in your house, make sure you follow all gun safety procedures.  Seek help if you have been physically or sexually abused.  What's next?  Visit your health care provider once a year for an annual wellness visit.  Ask your health care provider how often you should have your eyes and teeth checked.  Stay up to date on all vaccines.  This information is not intended to replace advice given to you by your health care provider. Make sure you discuss any questions you have with your health care provider.  Document Revised: 05/06/2021 Document Reviewed: 05/06/2021  Elsevier Patient Education  2024 [employer].

## 2024-07-31 ENCOUNTER — Ambulatory Visit: Payer: Self-pay | Admitting: Medical-Surgical

## 2024-07-31 LAB — CBC
Hematocrit: 39.4 % (ref 34.0–46.6)
Hemoglobin: 12.9 g/dL (ref 11.1–15.9)
MCH: 33.7 pg — ABNORMAL HIGH (ref 26.6–33.0)
MCHC: 32.7 g/dL (ref 31.5–35.7)
MCV: 103 fL — ABNORMAL HIGH (ref 79–97)
Platelets: 269 x10E3/uL (ref 150–450)
RBC: 3.83 x10E6/uL (ref 3.77–5.28)
RDW: 13 % (ref 11.7–15.4)
WBC: 4.5 x10E3/uL (ref 3.4–10.8)

## 2024-07-31 LAB — LIPID PANEL
Chol/HDL Ratio: 2.7 ratio (ref 0.0–4.4)
Cholesterol, Total: 189 mg/dL (ref 100–199)
HDL: 71 mg/dL (ref >39)
LDL Chol Calc (NIH): 110 mg/dL — ABNORMAL HIGH (ref 0–99)
Triglycerides: 41 mg/dL (ref 0–149)
VLDL Cholesterol Cal: 8 mg/dL (ref 5–40)

## 2024-07-31 LAB — CMP14+EGFR
ALT: 11 IU/L (ref 0–32)
AST: 21 IU/L (ref 0–40)
Albumin: 4.6 g/dL (ref 3.9–4.9)
Alkaline Phosphatase: 75 IU/L (ref 44–121)
BUN/Creatinine Ratio: 24 — ABNORMAL HIGH (ref 9–23)
BUN: 14 mg/dL (ref 6–24)
Bilirubin Total: 0.3 mg/dL (ref 0.0–1.2)
CO2: 21 mmol/L (ref 20–29)
Calcium: 9.2 mg/dL (ref 8.7–10.2)
Chloride: 103 mmol/L (ref 96–106)
Creatinine, Ser: 0.59 mg/dL (ref 0.57–1.00)
Globulin, Total: 2.8 g/dL (ref 1.5–4.5)
Glucose: 84 mg/dL (ref 70–99)
Potassium: 3.8 mmol/L (ref 3.5–5.2)
Sodium: 139 mmol/L (ref 134–144)
Total Protein: 7.4 g/dL (ref 6.0–8.5)
eGFR: 112 mL/min/1.73 (ref >59)

## 2024-07-31 LAB — TSH: TSH: 3.18 u[IU]/mL (ref 0.450–4.500)

## 2024-08-02 LAB — B12 AND FOLATE PANEL
Folate: 5.5 ng/mL (ref >3.0)
Vitamin B-12: 237 pg/mL (ref 232–1245)

## 2024-08-02 LAB — SPECIMEN STATUS REPORT

## 2024-08-13 ENCOUNTER — Ambulatory Visit (INDEPENDENT_AMBULATORY_CARE_PROVIDER_SITE_OTHER)

## 2024-08-13 VITALS — BP 127/78 | HR 75 | Resp 17 | Ht 62.0 in | Wt 122.0 lb

## 2024-08-13 DIAGNOSIS — I10 Essential (primary) hypertension: Secondary | ICD-10-CM | POA: Diagnosis not present

## 2024-08-13 NOTE — Progress Notes (Signed)
 Pt here for BP 2 wk recheck. BP 174/97 last office visit. Denies CP, SOB, or headaches.  Pt first BP was 144/76 pt sat for 10 mins and then rechecked. Second reading was  127/78. Pt advised to RTC in 3 months routine f/u

## 2024-08-20 ENCOUNTER — Ambulatory Visit (INDEPENDENT_AMBULATORY_CARE_PROVIDER_SITE_OTHER)

## 2024-08-20 DIAGNOSIS — L989 Disorder of the skin and subcutaneous tissue, unspecified: Secondary | ICD-10-CM | POA: Diagnosis not present

## 2024-08-20 DIAGNOSIS — R9389 Abnormal findings on diagnostic imaging of other specified body structures: Secondary | ICD-10-CM | POA: Diagnosis not present

## 2024-08-22 ENCOUNTER — Ambulatory Visit: Payer: Self-pay | Admitting: Obstetrics and Gynecology

## 2024-11-01 ENCOUNTER — Other Ambulatory Visit: Payer: Self-pay | Admitting: Obstetrics and Gynecology

## 2024-12-04 ENCOUNTER — Other Ambulatory Visit: Payer: Self-pay | Admitting: Medical-Surgical
